# Patient Record
Sex: Female | Born: 1965 | Hispanic: Yes | Marital: Single | Smoking: Former smoker
Health system: Southern US, Community
[De-identification: ages and names within clinical notes are randomized; demographics above are authoritative.]

---

## 2020-04-26 ENCOUNTER — Emergency Department
Admission: EM | Admit: 2020-04-26 | Discharge: 2020-04-26 | Disposition: A | Discharge status: Home / Self Care | Payer: Medicaid Other | Attending: Student in an Organized Health Care Education/Training Program | Admitting: Student in an Organized Health Care Education/Training Program

## 2020-04-26 ENCOUNTER — Emergency Department: Payer: Medicaid Other

## 2020-04-26 ENCOUNTER — Other Ambulatory Visit: Payer: Self-pay

## 2020-04-26 DIAGNOSIS — R2232 Localized swelling, mass and lump, left upper limb: Secondary | ICD-10-CM | POA: Insufficient documentation

## 2020-04-26 DIAGNOSIS — Z20822 Contact with and (suspected) exposure to covid-19: Secondary | ICD-10-CM | POA: Diagnosis not present

## 2020-04-26 DIAGNOSIS — R2243 Localized swelling, mass and lump, lower limb, bilateral: Secondary | ICD-10-CM | POA: Insufficient documentation

## 2020-04-26 DIAGNOSIS — R519 Headache, unspecified: Secondary | ICD-10-CM | POA: Diagnosis not present

## 2020-04-26 DIAGNOSIS — M791 Myalgia, unspecified site: Secondary | ICD-10-CM | POA: Diagnosis present

## 2020-04-26 DIAGNOSIS — R531 Weakness: Secondary | ICD-10-CM | POA: Diagnosis not present

## 2020-04-26 DIAGNOSIS — R0989 Other specified symptoms and signs involving the circulatory and respiratory systems: Secondary | ICD-10-CM | POA: Insufficient documentation

## 2020-04-26 LAB — URINALYSIS, COMPLETE (UACMP) WITH MICROSCOPIC
Bilirubin Urine: NEGATIVE
Glucose, UA: NEGATIVE mg/dL
Hgb urine dipstick: NEGATIVE
Ketones, ur: NEGATIVE mg/dL
Leukocytes,Ua: NEGATIVE
Nitrite: NEGATIVE
Protein, ur: NEGATIVE mg/dL
Specific Gravity, Urine: 1.003 — ABNORMAL LOW (ref 1.005–1.030)
pH: 6 (ref 5.0–8.0)

## 2020-04-26 LAB — CBC
HCT: 37.9 % (ref 36.0–46.0)
Hemoglobin: 12.9 g/dL (ref 12.0–15.0)
MCH: 29.8 pg (ref 26.0–34.0)
MCHC: 34 g/dL (ref 30.0–36.0)
MCV: 87.5 fL (ref 80.0–100.0)
Platelets: 251 10*3/uL (ref 150–400)
RBC: 4.33 MIL/uL (ref 3.87–5.11)
RDW: 12.6 % (ref 11.5–15.5)
WBC: 7.3 10*3/uL (ref 4.0–10.5)
nRBC: 0 % (ref 0.0–0.2)

## 2020-04-26 LAB — BASIC METABOLIC PANEL
Anion gap: 12 (ref 5–15)
BUN: 11 mg/dL (ref 6–20)
CO2: 24 mmol/L (ref 22–32)
Calcium: 9.6 mg/dL (ref 8.9–10.3)
Chloride: 104 mmol/L (ref 98–111)
Creatinine, Ser: 0.64 mg/dL (ref 0.44–1.00)
GFR calc Af Amer: >60 mL/min (ref >60)
GFR calc non Af Amer: >60 mL/min (ref >60)
Glucose, Bld: 92 mg/dL (ref 70–99)
Potassium: 4.1 mmol/L (ref 3.5–5.1)
Sodium: 140 mmol/L (ref 135–145)

## 2020-04-26 LAB — RESPIRATORY PANEL BY RT PCR (FLU A&B, COVID)
Influenza A by PCR: NEGATIVE
Influenza B by PCR: NEGATIVE
SARS Coronavirus 2 by RT PCR: NEGATIVE

## 2020-04-26 LAB — GROUP A STREP BY PCR: Group A Strep by PCR: NOT DETECTED

## 2020-04-26 NOTE — ED Provider Notes (Signed)
[facility] Emergency Department Provider Note  ___________________________________________   First MD Initiated Contact with Patient 04/26/20 1413     (approximate)  I have reviewed the triage vital signs and the nursing notes.   HISTORY  Chief Complaint Generalized body aches, wrist swelling, throat scratching  HPI Kaitlyn Sims is a 54 y.o. female who presents to the emergency department for evaluation of generalized body aches, wrist swelling, throat "scratchiness" that began 2 weeks ago when she started a new job.  She also states that she has a headache and swelling in her legs.  She believes that this began when she started working with chemicals at her new job, particularly "Oxivir".  She states that she has searched online and the symptoms she believes could be related to over exposure to oxivir.  She states that she has not been around anybody that is been sick that she is aware of.  She did get Covid tested 2 days ago and was negative at that time.  She denies chest pain, shortness of breath, abdominal pain, nausea vomiting or diarrhea.      No past medical history on file.  There are no problems to display for this patient.     Prior to Admission medications   Not on File    Allergies Patient has no allergy information on record.  No family history on file.  Social History Social History   Tobacco Use  . Smoking status: Not on file  Substance Use Topics  . Alcohol use: Not on file  . Drug use: Not on file    Review of Systems Constitutional: No fever/chills Eyes: No visual changes. ENT: " Scratchy" throat Cardiovascular: Denies chest pain. Respiratory: Denies shortness of breath. Gastrointestinal: No abdominal pain.  No nausea, no vomiting.  No diarrhea.  No constipation. Genitourinary: Negative for dysuria. Musculoskeletal: Negative for back pain. Skin: Negative for rash. Neurological: + headaches, negative for focal  weakness or numbness.   ____________________________________________   PHYSICAL EXAM:  VITAL SIGNS: ED Triage Vitals [04/26/20 1338]  Enc Vitals Group     BP (!) 146/80     Pulse Rate 75     Resp 18     Temp 98.8 F (37.1 C)     Temp Source Oral     SpO2 100 %     Weight 193 lb (87.5 kg)     Height 5\' 7"  (1.702 m)     Head Circumference      Peak Flow      Pain Score 0     Pain Loc      Pain Edu?      Excl. in GC?    Constitutional: Alert and oriented. Well appearing and in no acute distress. Eyes: Conjunctivae are normal. PERRL. EOMI. Head: Atraumatic. Nose: No congestion/rhinnorhea. Mouth/Throat: Mucous membranes are moist.  Oropharynx erythematous without exudate or tonsillar enlargement. Neck: No stridor.   Cardiovascular: Normal rate, regular rhythm. Grossly normal heart sounds.  Good peripheral circulation.  Respiratory: Normal respiratory effort.  No retractions. Lungs CTAB. Gastrointestinal: Soft and nontender. No distention. No CVA tenderness. Musculoskeletal: No upper extremity tenderness or swelling noted.  No lower extremity tenderness nor edema.  No joint effusions. Neurologic:  Normal speech and language. No gross focal neurologic deficits are appreciated. No gait instability. Skin:  Skin is warm, dry and intact. No rash noted. Psychiatric: Mood and affect are normal. Speech and behavior are normal. ____________________________________________   LABS (all labs ordered are listed,  but only abnormal results are displayed)  Labs Reviewed  URINALYSIS, COMPLETE (UACMP) WITH MICROSCOPIC - Abnormal; Notable for the following components:      Result Value   Color, Urine COLORLESS (*)    APPearance CLEAR (*)    Specific Gravity, Urine 1.003 (*)    Bacteria, UA RARE (*)    All other components within normal limits  RESPIRATORY PANEL BY RT PCR (FLU A&B, COVID)  GROUP A STREP BY PCR  CBC  BASIC METABOLIC PANEL    ___________________________________________  RADIOLOGY  Official radiology report(s): DG Chest Portable 1 View  Result Date: 04/26/2020 CLINICAL DATA:  Weakness.  Body aches.  Extremity swelling. EXAM: PORTABLE CHEST 1 VIEW COMPARISON:  None. FINDINGS: The cardiomediastinal contours are normal. The lungs are clear. Pulmonary vasculature is normal. No consolidation, pleural effusion, or pneumothorax. No acute osseous abnormalities are seen. IMPRESSION: No acute chest findings.  No evidence of fluid overload. Electronically Signed   By: Narda Rutherford M.D.   On: 04/26/2020 15:09    ____________________________________________   INITIAL IMPRESSION / ASSESSMENT AND PLAN / ED COURSE  As part of my medical decision making, I reviewed the following data within the electronic MEDICAL RECORD NUMBER Nursing notes reviewed and incorporated        Kaitlyn Sims is a 54 year old female who presents emergency department for evaluation of generalized malaise and fatigue, headache and subjective bilateral wrist swelling and bilateral lower extremity swelling.  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  Given that this has been 2 weeks of general malaise in the height of the COVID-19 pandemic, work-up was completed to rule out infectious source of her symptoms.  The patient has a normal CBC and BMP, negative respiratory panel and negative strep swab.  Patient also has a grossly normal urinalysis.  Chest x-ray is grossly normal.  At this time, the patient has a normal laboratory evaluation making infection unlikely to be a source of her symptoms.  The patient was recommended to have close follow-up with a primary care physician.  Explained multiple times to the patient that I do not have a test to determine if this is related to the chemicals that she is encountering at work.  She is understanding of this and will follow up with primary care or return to the  emergency department for any worsening.      ____________________________________________   FINAL CLINICAL IMPRESSION(S) / ED DIAGNOSES  Final diagnoses:  Weakness     ED Discharge Orders    None      *Please note:  Kaitlyn Sims was evaluated in Emergency Department on 04/26/2020 for the symptoms described in the history of present illness. She was evaluated in the context of the global COVID-19 pandemic, which necessitated consideration that the patient might be at risk for infection with the SARS-CoV-2 virus that causes COVID-19. Institutional protocols and algorithms that pertain to the evaluation of patients at risk for COVID-19 are in a state of rapid change based on information released by regulatory bodies including the CDC and federal and state organizations. These policies and algorithms were followed during the patient's care in the ED.  Some ED evaluations and interventions may be delayed as a result of limited staffing during and the pandemic.*   Note:  This document was prepared using Dragon voice recognition software and may include unintentional dictation errors.    Lucy Chris, PA 04/26/20 1826    Willy Eddy,  MD 04/27/20 3794

## 2020-04-26 NOTE — ED Notes (Signed)
Pt A&O, ambulatory. 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 C/o of HA, body aches, and arm swelling since then.

## 2020-04-26 NOTE — ED Triage Notes (Signed)
Pt here with wrist swelling, body aches, and throat scratchiness that started 2 weeks ago after she started a new job and started working with different chemicals. PT also c/o of HA and bilateral leg swelling. 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 Pt was tested for covid 2 days ago, negative result.

## 2020-06-19 ENCOUNTER — Ambulatory Visit: Payer: Self-pay | Attending: Oncology | Admitting: *Deleted

## 2020-06-19 ENCOUNTER — Other Ambulatory Visit: Payer: Self-pay

## 2020-06-19 ENCOUNTER — Encounter: Payer: Self-pay | Admitting: *Deleted

## 2020-06-19 VITALS — BP 114/67 | HR 79 | Temp 98.0°F | Ht 66.17 in | Wt 200.6 lb

## 2020-06-19 DIAGNOSIS — N6459 Other signs and symptoms in breast: Secondary | ICD-10-CM

## 2020-06-19 NOTE — Progress Notes (Signed)
Subjective:     Patient ID: Kaitlyn Sims, female   DOB: 04/25/1966, 54 y.o.   MRN: [id number]  HPI   [facility] Medical History Record - 06/18/20 1655      Breast History   Screening cycle New    Provider (CBE) [employer]    Initial Mammogram --   2 years   Last Herbalist (Mammogram)  [facility]    Recent Breast Symptoms None      Breast Cancer History   Breast Cancer History No personal or family history      Previous History of Breast Problems   Breast Surgery or Biopsy None    Breast Implants N/A    BSE Done Monthly      Gynecological/Obstetrical History   LMP --   stopped period 2 years ago   Is there any chance that the client could be pregnant?  No    Age at menarche 48    Age at menopause 41    PAP smear history Annually    Date of last PAP  05/30/20    Provider (PAP) [employer]    Age at first live birth 29    Breast fed children Yes (type length in comments)   12 months   DES Exposure No    Cervical, Uterine or Ovarian cancer No    Family history of Cervial, Uterine or Ovarian cancer No    Hysterectomy No    Cervix removed No    Ovaries removed No    Laser/Cryosurgery No    Current method of birth control None    Current method of Estrogen/Hormone replacement None    Smoking history Yes   3 cigarettes   Comments 4 in household ; 1240/ month             Review of Systems     Objective:   Physical Exam Chest:     Breasts:        Right: No swelling, bleeding, inverted nipple, mass, nipple discharge, skin change or tenderness.        Left: No swelling, bleeding, inverted nipple, mass, nipple discharge, skin change or tenderness.    Lymphadenopathy:     Upper Body:     Right upper body: No supraclavicular or axillary adenopathy.     Left upper body: No supraclavicular or axillary adenopathy.        Assessment:     54 year old English speaking Hispanic female referred to [facility] by the  [facility] for clinical breast exam and mammogram only .  Clinical breast exam without dominant mass, skin changes, nipple discharge or lymphadenopathy.  There is an asymmetrical thickening at 12:00 at the left breast.  Taught self breast awareness.  Last pap on 05/14/20 was completed at the [facility].  Those results are not available for review.  She will follow ASCCP guidelines for her next pap.  Patient has been screened for eligibility.  She does not have any insurance, Medicare or Medicaid.  She also meets financial eligibility.   Risk Assessment    Risk Scores      06/19/2020   Last edited by: Scarlett Presto, RN   5-year risk: 0.5 %   Lifetime risk: 4.4 %            Plan:     Will get bilateral diagnostic mammogram and ultrasound.  Patient to go by [facility]  to sign a consent for release of information to get her previous mammogram images from [place].  Will get her scheduled as soon as they are available per the breast center's protocol.  Message sent to [facility] and to Joellyn Quails to schedule patient as soon as imaging is available.  Will follow up per [facility] protocol.

## 2020-06-19 NOTE — Patient Instructions (Signed)
Gave patient hand-out, Women Staying Healthy, Active and Well from [facility], with education on breast health, pap smears, heart and colon health. 

## 2020-07-11 ENCOUNTER — Ambulatory Visit
Admission: RE | Admit: 2020-07-11 | Discharge: 2020-07-11 | Disposition: A | Discharge status: Home / Self Care | Payer: Medicaid Other | Source: Ambulatory Visit | Attending: Oncology | Admitting: Oncology

## 2020-07-11 ENCOUNTER — Encounter: Payer: Self-pay | Admitting: *Deleted

## 2020-07-11 ENCOUNTER — Other Ambulatory Visit: Payer: Self-pay

## 2020-07-11 DIAGNOSIS — N6459 Other signs and symptoms in breast: Secondary | ICD-10-CM

## 2020-07-11 NOTE — Progress Notes (Signed)
Letter mailed from the [facility] to inform patient of her normal mammogram results.  Patient is to follow-up with annual screening in one year. 

## 2021-09-05 IMAGING — MG DIGITAL DIAGNOSTIC BILAT W/ TOMO W/ CAD
6 of 10 series · 6 of 30 positions shown · non-contrast
Comparison: Previous exam(s).

CLINICAL DATA: 54-year-old female with provider palpated
"thickening" along the upper left breast.

EXAM:
DIGITAL DIAGNOSTIC BILATERAL MAMMOGRAM WITH CAD AND TOMO
ULTRASOUND LEFT BREAST

[L MLO synth-2D (1 of 2)]
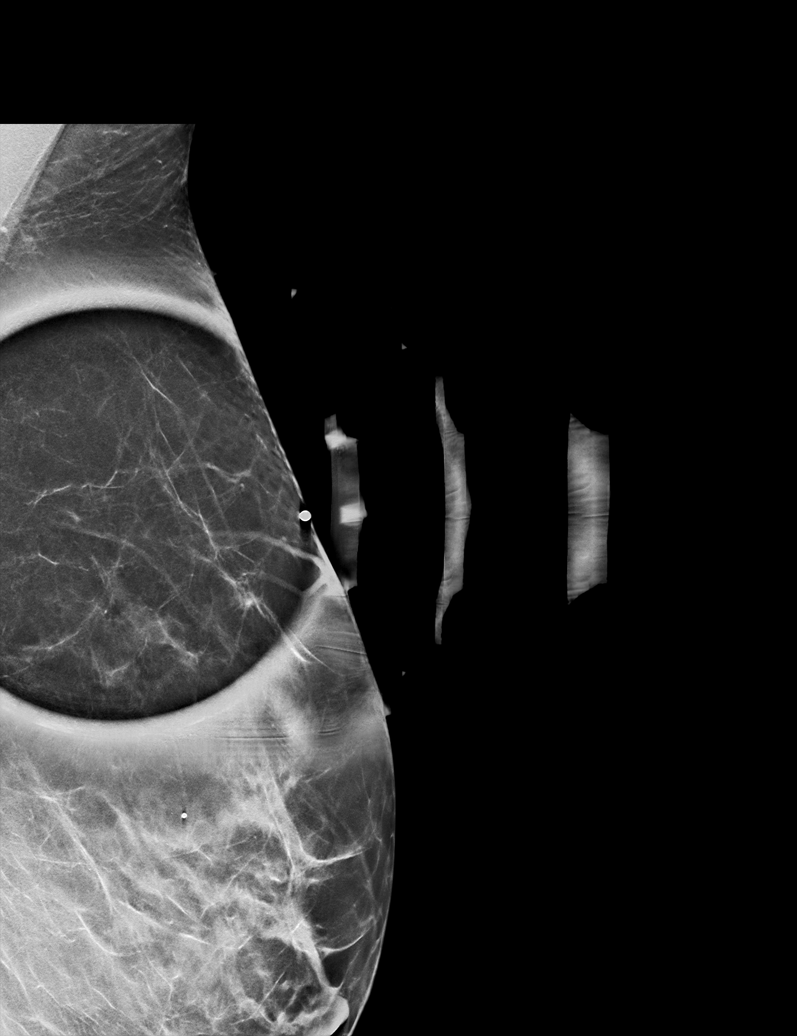

[R CC synth-2D]
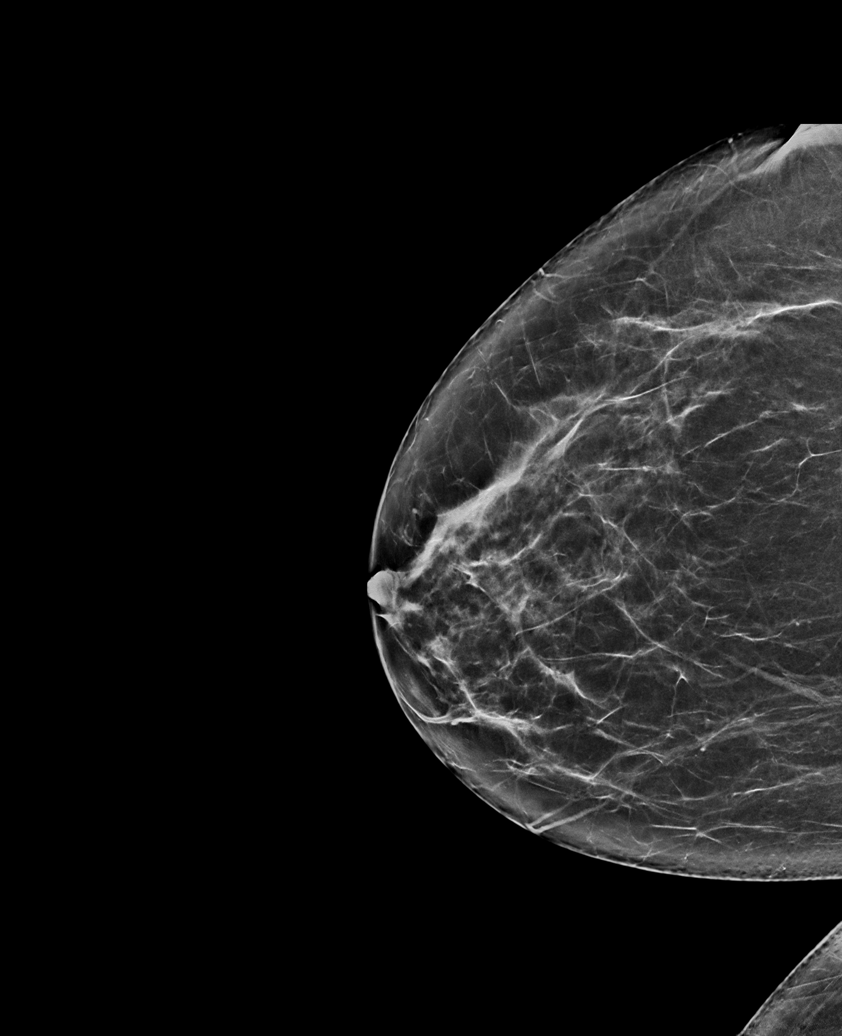

[L CC synth-2D]
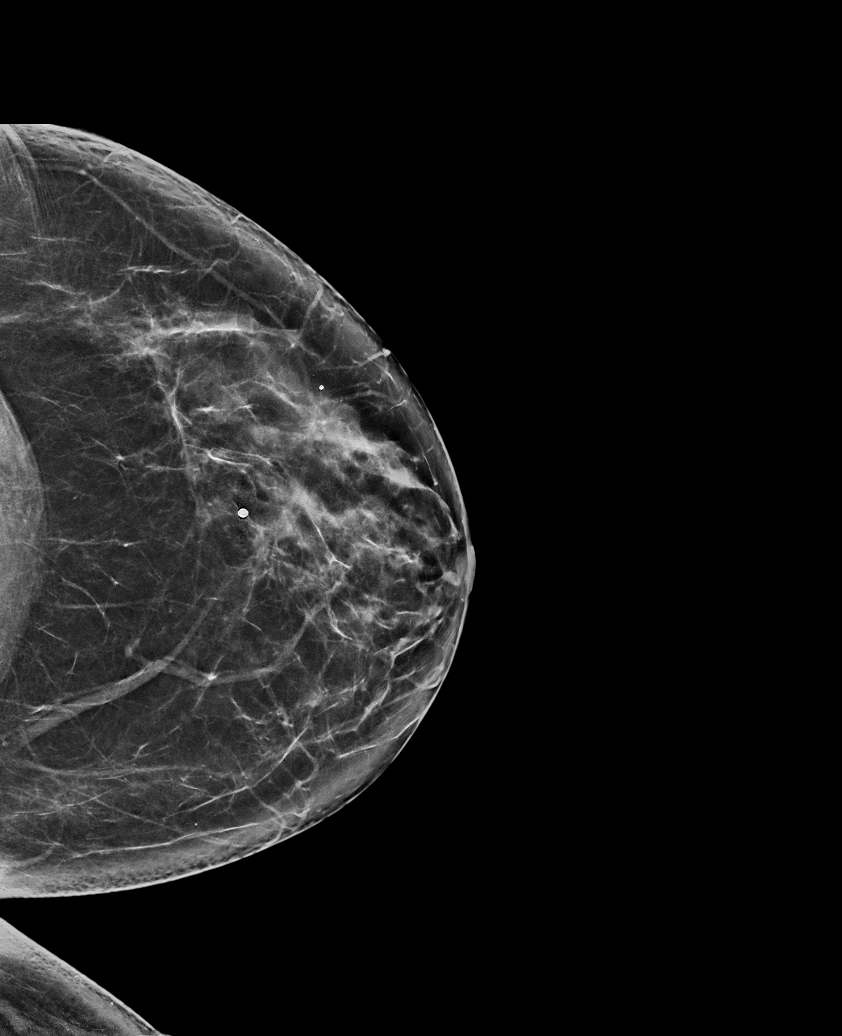

[R MLO synth-2D]
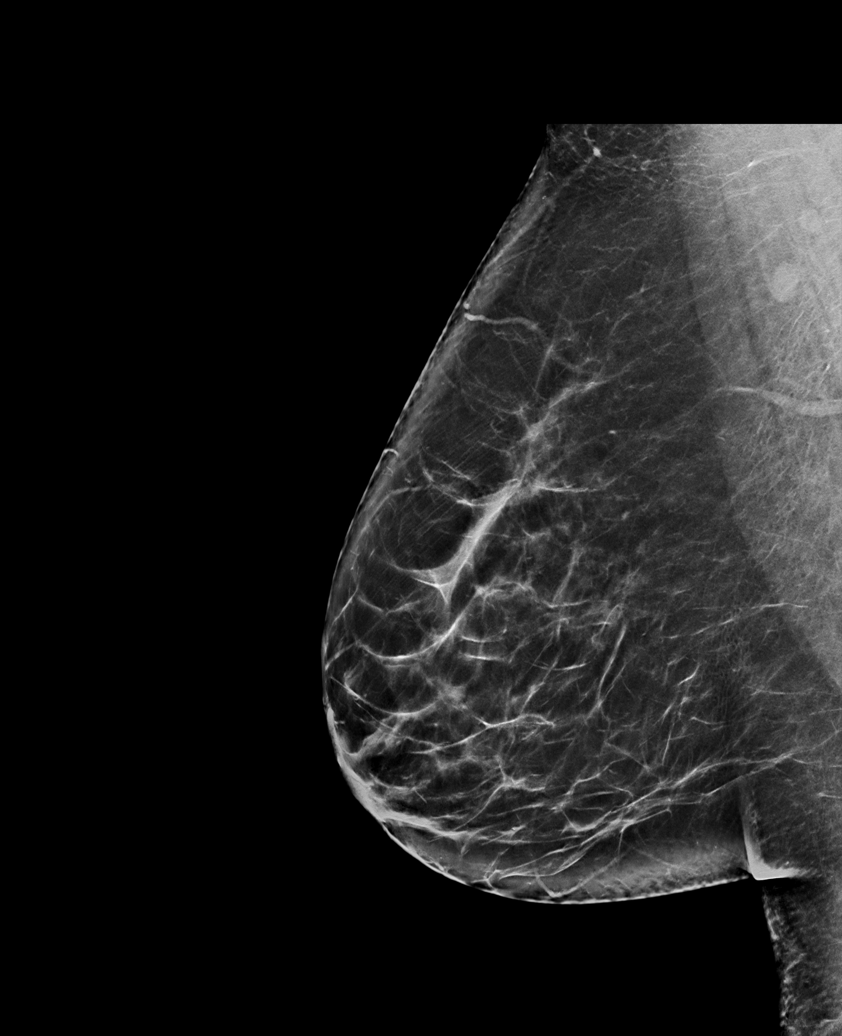

[L MLO synth-2D (2 of 2)]
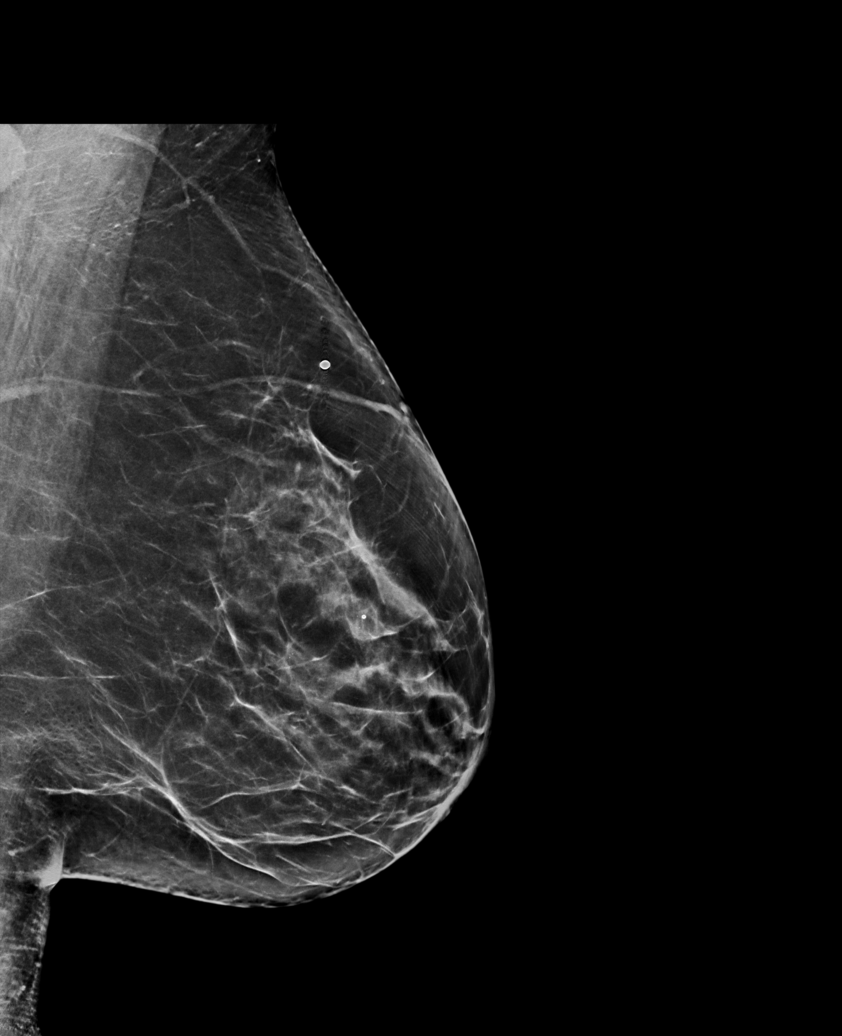

[L MLO tomo · tomo slice 33/66.0]
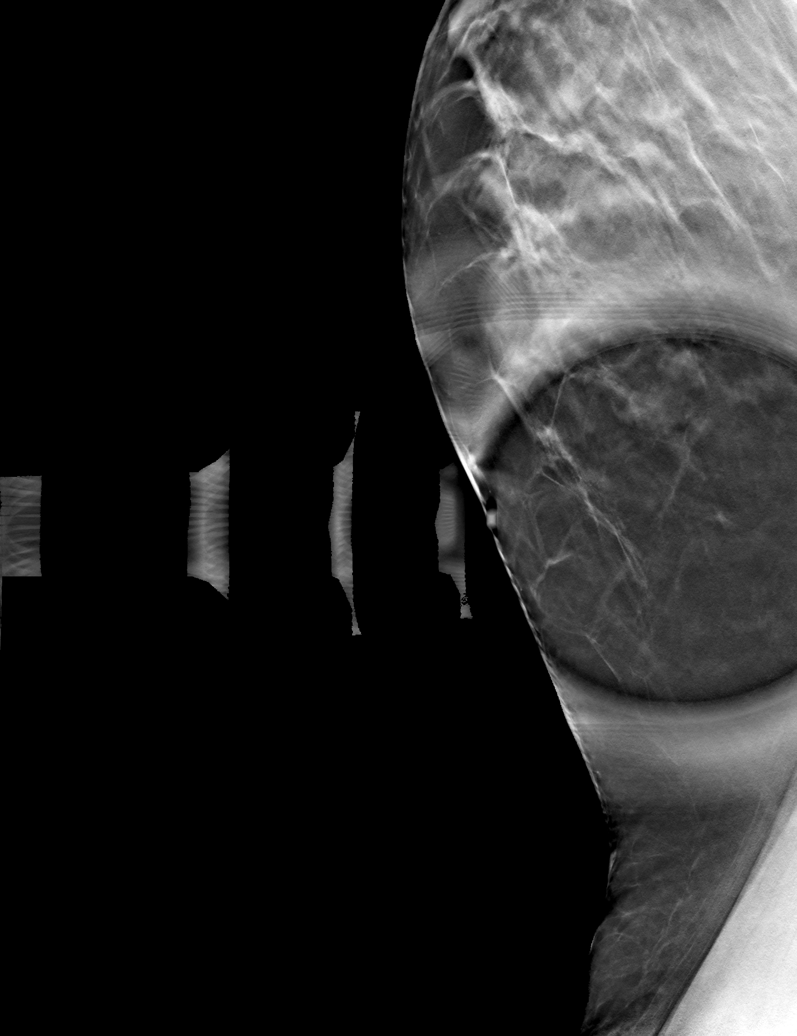

[6 of 30 positions shown; findings below may reference images not displayed]

ACR Breast Density Category c: The breast tissue is heterogeneously
dense, which may obscure small masses.
FINDINGS: A radiopaque BB was placed at the site of the patient's palpable
abnormality in the upper left breast. No focal or suspicious
mammographic findings are seen deep to the radiopaque BB. No
suspicious findings are identified in the remainder of either
breast.

Mammographic images were processed with CAD.

Targeted ultrasound is performed, showing normal fibroglandular
tissue without focal or suspicious sonographic abnormality.
Evaluation along the upper left breast was performed.
IMPRESSION: 1. No mammographic evidence of malignancy in either breast.
2. No suspicious sonographic findings at the site of the patient's
left breast palpable abnormality. Recommendation is for clinical and
symptomatic follow-up.

RECOMMENDATION:
1. Clinical follow-up recommended for the palpable area of concern
in the left breast. Any further workup should be based on clinical
grounds.
2.  Screening mammogram in one year.(Code:JR-O-H5V)

I have discussed the findings and recommendations with the patient.
If applicable, a reminder letter will be sent to the patient
regarding the next appointment.

BI-RADS CATEGORY  1: Negative.

## 2023-05-07 LAB — COLOGUARD

## 2024-03-09 ENCOUNTER — Ambulatory Visit

## 2024-04-06 ENCOUNTER — Ambulatory Visit: Admitting: Family Medicine

## 2024-05-02 ENCOUNTER — Ambulatory Visit: Admitting: Family Medicine

## 2024-06-07 ENCOUNTER — Ambulatory Visit (INDEPENDENT_AMBULATORY_CARE_PROVIDER_SITE_OTHER)

## 2024-06-07 VITALS — BP 122/64 | HR 85 | Resp 16 | Ht 67.0 in | Wt 207.2 lb

## 2024-06-07 DIAGNOSIS — Z136 Encounter for screening for cardiovascular disorders: Secondary | ICD-10-CM | POA: Diagnosis not present

## 2024-06-07 DIAGNOSIS — Z6832 Body mass index (BMI) 32.0-32.9, adult: Secondary | ICD-10-CM

## 2024-06-07 DIAGNOSIS — M545 Low back pain, unspecified: Secondary | ICD-10-CM | POA: Diagnosis not present

## 2024-06-07 DIAGNOSIS — Z1231 Encounter for screening mammogram for malignant neoplasm of breast: Secondary | ICD-10-CM

## 2024-06-07 DIAGNOSIS — G8929 Other chronic pain: Secondary | ICD-10-CM

## 2024-06-07 DIAGNOSIS — E66811 Obesity, class 1: Secondary | ICD-10-CM

## 2024-06-07 DIAGNOSIS — Z131 Encounter for screening for diabetes mellitus: Secondary | ICD-10-CM | POA: Diagnosis not present

## 2024-06-07 DIAGNOSIS — Z114 Encounter for screening for human immunodeficiency virus [HIV]: Secondary | ICD-10-CM

## 2024-06-07 DIAGNOSIS — Z1159 Encounter for screening for other viral diseases: Secondary | ICD-10-CM

## 2024-06-07 NOTE — Progress Notes (Signed)
 New patient visit   Patient: JOYELLE SIEDLECKI   DOB: Dec 15, 1965   58 y.o. Female  MRN: [id number] Visit Date: 06/07/2024  Today's healthcare provider: Isaiah DELENA Pepper, MD   Chief Complaint  Patient presents with   New Patient (Initial Visit)    NP/Wt loss/ lower back pain (LT)   Subjective    Dilynn A Kasa is a 58 y.o. female who presents today as a new patient to establish care.   Discussed the use of AI scribe software for clinical note transcription with the patient, who gave verbal consent to proceed.  History of Present Illness BRENNYN ORTLIEB is a 58 year old female who presents with concerns about weight loss and back pain.  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 She maintains a healthy diet, cooking meals with rice, vegetables, and protein, and primarily drinks water, avoiding soda except on special occasions. She usually eats two meals a day but sometimes snacks on peanut butter and pretzels at night, feeling she might overeat these. She tried Mounjaro, a medication her mother used, and lost ten pounds, but is concerned about the cost and insurance coverage for weight loss medications.  She has been experiencing low back pain for the past two weeks, located on the left side. She takes Motrin when the pain is severe but prefers to avoid medication. She has tried stretching exercises and Icy Hot patches in the past, which provided temporary relief. The pain does not radiate down her legs, and there is no numbness or tingling. She feels the pain is more pronounced when her weight exceeds 200 pounds.  Her social history includes working at [employer], where she is physically active. She has four children and is a grandmother. She quit smoking two years ago after smoking 1-2 cigarettes daily for many years. She does not consume alcohol or use tobacco products currently. She  takes a multivitamin and occasionally drinks turmeric tea.  History reviewed. No pertinent past medical history. History reviewed. No pertinent surgical history. Family Status  Relation Name Status   Neg Hx  (Not Specified)  No partnership data on file   Family History  Problem Relation Age of Onset   Breast cancer Neg Hx    Social History   Socioeconomic History   Marital status: Single    Spouse name: Not on file   Number of children: Not on file   Years of education: Not on file   Highest education level: Not on file  Occupational History   Not on file  Tobacco Use   Smoking status: Former    Types: Cigarettes   Smokeless tobacco: Never  Substance and Sexual Activity   Alcohol use: Never   Drug use: Never   Sexual activity: Not on file  Other Topics Concern   Not on file  Social History Narrative   Not on file   Social Drivers of Health   Financial Resource Strain: Not on file  Food Insecurity: Not on file  Transportation Needs: Not on file  Physical Activity: Not on file  Stress: Not on file  Social Connections: Not on file   No outpatient medications prior to visit.   No facility-administered medications prior to visit.   Not on File  Reviews of Systems as noted in HPI.      Objective    BP 122/64 (BP Location: Right Arm, Patient Position: Sitting,  Cuff Size: Normal)   Pulse 85   Resp 16   Ht 5' 7 (1.702 m)   Wt 207 lb 3.2 oz (94 kg)   SpO2 100%   BMI 32.45 kg/m     Physical Exam Constitutional:      Appearance: Normal appearance.  HENT:     Head: Normocephalic and atraumatic.     Mouth/Throat:     Mouth: Mucous membranes are moist.  Eyes:     Pupils: Pupils are equal, round, and reactive to light.  Cardiovascular:     Rate and Rhythm: Normal rate and regular rhythm.     Heart sounds: Normal heart sounds.  Pulmonary:     Effort: Pulmonary effort is normal.     Breath sounds: Normal breath sounds.  Musculoskeletal:     Lumbar  back: Tenderness present. No bony tenderness.     Comments: L paraspinal muscle tenderness around L4-L5  Skin:    General: Skin is warm.  Neurological:     General: No focal deficit present.     Mental Status: She is alert.     Depression Screen    06/07/2024    9:25 AM  PHQ 2/9 Scores  PHQ - 2 Score 0  PHQ- 9 Score 6   No results found for any visits on 06/07/24.  Assessment & Plan      Problem List Items Addressed This Visit       Other   Class 1 obesity with body mass index (BMI) of 32.0 to 32.9 in adult   Relevant Orders   Amb Ref to Medical Weight Management   Chronic left-sided low back pain without sciatica - Primary   Relevant Orders   Ambulatory referral to Physical Therapy   Other Visit Diagnoses       Screening for cardiovascular condition       Relevant Orders   Comprehensive metabolic panel with GFR   Lipid panel     Screening for diabetes mellitus (DM)       Relevant Orders   Hemoglobin A1c     Need for hepatitis C screening test       Relevant Orders   Hepatitis C antibody     Screening for HIV (human immunodeficiency virus)       Relevant Orders   HIV Antibody (routine testing w rflx)     Screening mammogram for breast cancer       Relevant Orders   MM 3D SCREENING MAMMOGRAM BILATERAL BREAST      Assessment & Plan Obesity Difficulty losing weight despite active lifestyle and healthy diet. Interested in weight loss medications, but insurance coverage is limited. Blood work needed to assess for diabetes and other metabolic conditions. - Ordered blood work to assess blood sugar, kidney function, liver function, and cholesterol. - [facility] to [facility] in [place] for weight loss management. - Advised to check with [employer] Shield regarding coverage for weight loss medications. - Discussed eating a balanced diet and incorporating movement into daily routine.   Chronic left-sided low back pain Chronic,  uncontrolled. Persistent muscle pain not relieved by Motrin. No bony tenderness, no numbness or tingling. Pain may be exacerbated by weight gain. - Recommended 4% lidocaine patches (Salonpas) for pain relief. - Recommend ibuprofen and tylenol PRN, ice PRN - Referred to physical therapy for strengthening exercises for low back and abdominal muscles. - Advised against lifting heavy objects and recommended using legs when lifting at work  [employer]  Health Maintenance Due for Pap smear and mammogram. Last Pap smear 3-4 years ago. No recent mammogram. - Scheduled follow-up appointment in one month for Pap smear. - Referred for mammogram at [facility]  Return in about 4 weeks (around 07/05/2024) for pap smear, vaccines.      Isaiah DELENA Pepper, MD  [facility] [phone] (phone) [phone] (fax)

## 2024-06-07 NOTE — Patient Instructions (Signed)
 I recommend Salonpas lidocaine patches (4%).

## 2024-06-08 ENCOUNTER — Other Ambulatory Visit: Payer: Self-pay

## 2024-06-08 ENCOUNTER — Ambulatory Visit: Payer: Self-pay

## 2024-06-08 DIAGNOSIS — E7800 Pure hypercholesterolemia, unspecified: Secondary | ICD-10-CM

## 2024-06-08 LAB — COMPREHENSIVE METABOLIC PANEL WITH GFR
ALT: 16 IU/L (ref 0–32)
AST: 18 IU/L (ref 0–40)
Albumin: 4.3 g/dL (ref 3.8–4.9)
Alkaline Phosphatase: 112 IU/L (ref 49–135)
BUN/Creatinine Ratio: 20 (ref 9–23)
BUN: 14 mg/dL (ref 6–24)
Bilirubin Total: 0.3 mg/dL (ref 0.0–1.2)
CO2: 22 mmol/L (ref 20–29)
Calcium: 9.7 mg/dL (ref 8.7–10.2)
Chloride: 103 mmol/L (ref 96–106)
Creatinine, Ser: 0.7 mg/dL (ref 0.57–1.00)
Globulin, Total: 3.2 g/dL (ref 1.5–4.5)
Glucose: 92 mg/dL (ref 70–99)
Potassium: 4.5 mmol/L (ref 3.5–5.2)
Sodium: 141 mmol/L (ref 134–144)
Total Protein: 7.5 g/dL (ref 6.0–8.5)
eGFR: 101 mL/min/1.73 (ref >59)

## 2024-06-08 LAB — LIPID PANEL
Chol/HDL Ratio: 5.8 ratio — ABNORMAL HIGH (ref 0.0–4.4)
Cholesterol, Total: 291 mg/dL — ABNORMAL HIGH (ref 100–199)
HDL: 50 mg/dL (ref >39)
LDL Chol Calc (NIH): 227 mg/dL — ABNORMAL HIGH (ref 0–99)
Triglycerides: 85 mg/dL (ref 0–149)
VLDL Cholesterol Cal: 14 mg/dL (ref 5–40)

## 2024-06-08 LAB — HEMOGLOBIN A1C
Est. average glucose Bld gHb Est-mCnc: 114 mg/dL
Hgb A1c MFr Bld: 5.6 % (ref 4.8–5.6)

## 2024-06-08 LAB — HEPATITIS C ANTIBODY: Hep C Virus Ab: NONREACTIVE

## 2024-06-08 LAB — HIV ANTIBODY (ROUTINE TESTING W REFLEX): HIV Screen 4th Generation wRfx: NONREACTIVE

## 2024-06-08 MED ORDER — ATORVASTATIN CALCIUM 40 MG PO TABS: 40.0000 mg | ORAL_TABLET | Freq: Every day | ORAL | 3 refills | Status: AC

## 2024-06-08 NOTE — Telephone Encounter (Signed)
-----   Message from Isaiah DELENA Pepper sent at 06/08/2024  9:03 AM EST ----- Please call patient and ensure she has seen the message below. Please schedule her a follow up to see me in 4-6 weeks. ----- Message ----- From: Rebecka Memos Lab Results In Sent: 06/08/2024   7:37 AM EST To: Isaiah DELENA Pepper, MD

## 2024-06-13 ENCOUNTER — Encounter (INDEPENDENT_AMBULATORY_CARE_PROVIDER_SITE_OTHER): Payer: Self-pay

## 2024-07-05 ENCOUNTER — Other Ambulatory Visit (HOSPITAL_COMMUNITY): Admission: RE | Admit: 2024-07-05 | Discharge: 2024-07-05 | Disposition: A | Source: Ambulatory Visit

## 2024-07-05 ENCOUNTER — Ambulatory Visit

## 2024-07-05 VITALS — BP 117/67 | HR 80 | Resp 16 | Ht 67.0 in | Wt 205.0 lb

## 2024-07-05 DIAGNOSIS — Z124 Encounter for screening for malignant neoplasm of cervix: Secondary | ICD-10-CM

## 2024-07-05 DIAGNOSIS — N814 Uterovaginal prolapse, unspecified: Secondary | ICD-10-CM | POA: Insufficient documentation

## 2024-07-05 DIAGNOSIS — Z113 Encounter for screening for infections with a predominantly sexual mode of transmission: Secondary | ICD-10-CM | POA: Insufficient documentation

## 2024-07-05 DIAGNOSIS — E7800 Pure hypercholesterolemia, unspecified: Secondary | ICD-10-CM | POA: Insufficient documentation

## 2024-07-05 DIAGNOSIS — Z1211 Encounter for screening for malignant neoplasm of colon: Secondary | ICD-10-CM

## 2024-07-05 NOTE — Therapy (Incomplete)
 OUTPATIENT PHYSICAL THERAPY THORACOLUMBAR EVALUATION   Patient Name: Kaitlyn Sims MRN: [id number] DOB:06-16-1966, 58 y.o., female Today's Date: 07/05/2024  END OF SESSION:   No past medical history on file. No past surgical history on file. Patient Active Problem List   Diagnosis Date Noted   Pure hypercholesterolemia 07/05/2024   Uterine prolapse 07/05/2024   Class 1 obesity with body mass index (BMI) of 32.0 to 32.9 in adult 06/07/2024   Chronic left-sided low back pain without sciatica 06/07/2024    PCP: Franchot Isaiah DELENA, MD  REFERRING PROVIDER: Franchot Isaiah DELENA, MD  REFERRING DIAG:  [phone] (ICD-10-CM) - Chronic left-sided low back pain without sciatica    RATIONALE FOR EVALUATION AND TREATMENT: Rehabilitation  THERAPY DIAG: No diagnosis found.  ONSET DATE: ***  FOLLOW-UP APPT SCHEDULED WITH REFERRING PROVIDER: {yes/no:20286}    SUBJECTIVE:                                                                                                                                                                                         SUBJECTIVE STATEMENT:    Patient is a 58 y.o. female with a chief concern of chronic L sided low back pain.   PERTINENT HISTORY:   ***  She has been experiencing low back pain for the past two weeks, located on the left side. She takes Motrin when the pain is severe but prefers to avoid medication. She has tried stretching exercises and Icy Hot patches in the past, which provided temporary relief. The pain does not radiate down her legs, and there is no numbness or tingling. She feels the pain is more pronounced when her weight exceeds 200 pounds.  working at [employer], where she is physically active. She has four children and is a grandmother.    PAIN:    Pain Intensity: Present: /10, Best: /10, Worst: /10 Pain location: *** Pain Quality: {PAIN DESCRIPTION:21022940}  Radiating: {yes/no:20286}  Numbness/Tingling:  {yes/no:20286} Focal Weakness: {yes/no:20286} Aggravating factors: *** Relieving factors: *** 24-hour pain behavior: *** How long can you sit: How long can you stand: History of prior back injury, pain, surgery, or therapy: {yes/no:20286} Dominant hand: {RIGHT/LEFT:20294} Imaging: {yes/no:20286}  Red flags: Negative for bowel/bladder changes, saddle paresthesia, personal history of cancer, h/o spinal tumors, h/o compression fx, h/o abdominal aneurysm, abdominal pain, chills/fever, night sweats, nausea, vomiting, unrelenting pain, first onset of insidious LBP <20 y/o  PRECAUTIONS: {Therapy precautions:24002}  WEIGHT BEARING RESTRICTIONS: {Yes ***/No:24003}  FALLS: Has patient fallen in last 6 months? {fallsyesno:27318}  Living Environment Lives with: {[facility] lives with:25569::lives with their family} Lives in: {Lives in:25570} Stairs: {opstairs:27293} Has following equipment at home: {Assistive devices:23999}  Prior level of function: {PLOF:24004}  Occupational demands:   Hobbies:   Patient Goals: ***   OBJECTIVE:  Patient Surveys  {rehab surveys:24030}  Cognition Patient is oriented to person, place, and time.  Recent memory is intact.  Remote memory is intact.  Attention span and concentration are intact.  Expressive speech is intact.  Patient's fund of knowledge is within normal limits for educational level.    Gross Musculoskeletal Assessment Tremor: None Bulk: Normal Tone: Normal No visible step-off along spinal column, no signs of scoliosis  GAIT: Distance walked: *** Assistive device utilized: {Assistive devices:23999} Level of assistance: {Levels of assistance:24026} Comments: ***  Posture: Lumbar lordosis: WNL Iliac crest height: Equal bilaterally Lumbar lateral shift: Negative  AROM AROM (Normal range in degrees) AROM   Lumbar   Flexion (65)   Extension (30)   Right lateral flexion (25)   Left lateral flexion (25)   Right rotation (30)    Left rotation (30)       Hip Right Left  Flexion (125)    Extension (15)    Abduction (40)    Adduction     Internal Rotation (45)    External Rotation (45)        Knee    Flexion (135)    Extension (0)        Ankle    Dorsiflexion (20)    Plantarflexion (50)    Inversion (35)    Eversion (15)    (* = pain; Blank rows = not tested)  LE MMT: MMT (out of 5) Right  Left   Hip flexion    Hip extension    Hip abduction    Hip adduction    Hip internal rotation    Hip external rotation    Knee flexion    Knee extension    Ankle dorsiflexion    Ankle plantarflexion    Ankle inversion    Ankle eversion    (* = pain; Blank rows = not tested)  Sensation Grossly intact to light touch throughout bilateral LEs as determined by testing dermatomes L2-S2. Proprioception, stereognosis, and hot/cold testing deferred on this date.  Reflexes R/L Knee Jerk (L3/4): 2+/2+  Ankle Jerk (S1/2): 2+/2+   Muscle Length Hamstrings: R: {NEGATIVE/POSITIVE QNM:80001} L: {NEGATIVE/POSITIVE QNM:80001} Ely (quadriceps): R: {NEGATIVE/POSITIVE QNM:80001} L: {NEGATIVE/POSITIVE QNM:80001} Thomas (hip flexors): R: {NEGATIVE/POSITIVE QNM:80001} L: {NEGATIVE/POSITIVE QNM:80001} Ober: R: {NEGATIVE/POSITIVE QNM:80001} L: {NEGATIVE/POSITIVE QNM:80001}  Palpation Location Right Left         Lumbar paraspinals    Quadratus Lumborum    Gluteus Maximus    Gluteus Medius    Deep hip external rotators    PSIS    Fortin's Area (SIJ)    Greater Trochanter    (Blank rows = not tested) Graded on 0-4 scale (0 = no pain, 1 = pain, 2 = pain with wincing/grimacing/flinching, 3 = pain with withdrawal, 4 = unwilling to allow palpation)  Passive Accessory  Motion Pt denies reproduction of back pain with CPA L1-L5 and UPA bilaterally L1-L5. Generally, hypomobile throughout  Special Tests Lumbar Radiculopathy and Discogenic: Centralization and Peripheralization (SN 92, -LR 0.12): {NEGATIVE/POSITIVE  FOR:19998} Slump (SN 83, -LR 0.32): R: {NEGATIVE/POSITIVE QNM:80001} L: {NEGATIVE/POSITIVE FOR:19998} SLR (SN 92, -LR 0.29): R: {NEGATIVE/POSITIVE QNM:80001} L:  {NEGATIVE/POSITIVE QNM:80001} Crossed SLR (SP 90): R: {NEGATIVE/POSITIVE QNM:80001} L: {NEGATIVE/POSITIVE QNM:80001}  Facet Joint: Extension-Rotation (SN 100, -LR 0.0): R: {NEGATIVE/POSITIVE QNM:80001} L: {NEGATIVE/POSITIVE QNM:80001}  Lumbar Foraminal Stenosis: Lumbar quadrant (SN 70): R: {NEGATIVE/POSITIVE QNM:80001} L: {NEGATIVE/POSITIVE  QNM:80001}  Hip: FABER (SN 81): R: {NEGATIVE/POSITIVE FOR:19998} L: {NEGATIVE/POSITIVE QNM:80001} FADIR (SN 94): R: {NEGATIVE/POSITIVE FOR:19998} L: {NEGATIVE/POSITIVE QNM:80001} Hip scour (SN 50): R: {NEGATIVE/POSITIVE QNM:80001} L: {NEGATIVE/POSITIVE QNM:80001}  SIJ:  Thigh Thrust (SN 88, -LR 0.18) : R: {NEGATIVE/POSITIVE QNM:80001} L: {NEGATIVE/POSITIVE QNM:80001}  Piriformis Syndrome: FAIR Test (SN 88, SP 83): R: {NEGATIVE/POSITIVE QNM:80001} L: {NEGATIVE/POSITIVE QNM:80001}  Functional Tasks Lifting: Deep squat: Sit to stand: Forward Step-Down Test: R:  L:  Lateral Step-Down Test: R:  L:   Beighton scale  LEFT  RIGHT           1. Passive dorsiflexion and hyperextension of the fifth MCP joint beyond 90  0 0   2. Passive apposition of the thumb to the flexor aspect of the forearm  0  0   3. Passive hyperextension of the elbow beyond 10  0  0   4. Passive hyperextension of the knee beyond 10  0  0   5. Active forward flexion of the trunk with the knees fully extended so that the palms of the hands rest flat on the floor   0   TOTAL         0/ 9      TODAY'S TREATMENT: DATE: ***     PATIENT EDUCATION:  Education details: *** Person educated: {Person educated:25204} Education method: {Education Method:25205} Education comprehension: {Education Comprehension:25206}   HOME EXERCISE PROGRAM:     ASSESSMENT:  CLINICAL IMPRESSION: Patient is a *** y.o. *** who was  seen today for physical therapy evaluation and treatment for ***.   OBJECTIVE IMPAIRMENTS: {opptimpairments:25111}.   ACTIVITY LIMITATIONS: {activitylimitations:27494}  PARTICIPATION LIMITATIONS: {participationrestrictions:25113}  PERSONAL FACTORS: {Personal factors:25162} are also affecting patient's functional outcome.   REHAB POTENTIAL: {rehabpotential:25112}  CLINICAL DECISION MAKING: {clinical decision making:25114}  EVALUATION COMPLEXITY: {Evaluation complexity:25115}   GOALS: Goals reviewed with patient? {yes/no:20286}  SHORT TERM GOALS: Target date: {follow up:25551}  Pt will be independent with HEP in order to improve strength and decrease back pain to improve pain-free function at home and work. Baseline: *** Goal status: INITIAL   LONG TERM GOALS: Target date: {follow up:25551}  Pt will increase FOTO to at least *** to demonstrate significant improvement in function at home and work related to back pain  Baseline:  Goal status: INITIAL  2.  Pt will decrease worst back pain by at least 2 points on the NPRS in order to demonstrate clinically significant reduction in back pain. Baseline: *** Goal status: INITIAL  3.  Pt will decrease mODI score by at least 13 points in order demonstrate clinically significant reduction in back pain/disability.       Baseline: *** Goal status: INITIAL  4.  *** Baseline: *** Goal status: INITIAL   PLAN:  PT FREQUENCY: {rehab frequency:25116}  PT DURATION: {rehab duration:25117}  PLANNED INTERVENTIONS: {rehab planned interventions:25118::97110-Therapeutic exercises,97530- Therapeutic [phone]- Neuromuscular re-education,97535- Self Rjmz,02859- Manual therapy,Patient/Family education}  PLAN FOR NEXT SESSION: ***   Lonni Pall PT, DPT Physical Therapist- [place]  07/05/2024, 10:17 PM

## 2024-07-05 NOTE — Patient Instructions (Signed)
 [facility] at [facility]   [address], [address] [facility] [place],  [place]  [address] Get Driving Directions Main: [phone]

## 2024-07-05 NOTE — Progress Notes (Signed)
 Established patient visit   Patient: Kaitlyn Sims   DOB: 06/14/1966   58 y.o. Female  MRN: [id number] Visit Date: 07/05/2024  Today's healthcare provider: Isaiah DELENA Pepper, MD   Chief Complaint  Patient presents with   Follow-up    4 wk f/u.SABRA No other concerns   Subjective    HPI  Discussed the use of AI scribe software for clinical note transcription with the patient, who gave verbal consent to proceed.  History of Present Illness Kaitlyn Sims is a 58 year old female who presents for follow-up and Pap smear.  She has been diagnosed with high cholesterol and started taking medication for it last week. Initially, there was confusion regarding the prescription, but she has since begun taking the medication as directed. No side effects from the medication have been reported. She is concerned about her diet, particularly her intake of eggs.  Her last Pap smear was prolonged due to difficulty locating the cervix. Denies abnormal pap smears. She has four children and notes that her last child was large.   Medications: Outpatient Medications Prior to Visit  Medication Sig   atorvastatin  (LIPITOR) 40 MG tablet Take 1 tablet (40 mg total) by mouth daily.   No facility-administered medications prior to visit.    Review of Systems as noted in HPI.      Objective    BP 117/67 (BP Location: Left Arm, Patient Position: Sitting, Cuff Size: Normal)   Pulse 80   Resp 16   Ht 5' 7 (1.702 m)   Wt 205 lb (93 kg)   SpO2 98%   BMI 32.11 kg/m    Physical Exam Exam conducted with a chaperone present.  Constitutional:      Appearance: Normal appearance.  HENT:     Head: Normocephalic and atraumatic.     Mouth/Throat:     Mouth: Mucous membranes are moist.  Eyes:     Pupils: Pupils are equal, round, and reactive to light.  Pulmonary:     Effort: Pulmonary effort is normal.  Genitourinary:    General: Normal vulva.     Vagina: Normal.     Uterus: With  uterine prolapse.      Comments: Cervix posterior, difficult to fully visualize due to uterine prolapse. Skin:    General: Skin is warm.  Neurological:     General: No focal deficit present.     Mental Status: She is alert.      No results found for any visits on 07/05/24.  Assessment & Plan     Problem List Items Addressed This Visit       Genitourinary   Uterine prolapse   Relevant Orders   Ambulatory referral to Obstetrics / Gynecology     Other   Pure hypercholesterolemia - Primary   Other Visit Diagnoses       Screening for colon cancer       Relevant Orders   Cologuard     Screening for cervical cancer       Relevant Orders   Cytology - PAP      Assessment & Plan Woman's Wellness Visit Due for pap smear, mammogram, and colonoscopy. Pap smear performed with difficulty due to uterine prolapse. - Performed Pap smear today - Provided number for mammogram scheduling. - Sent Cologuard kit for colon cancer screening.  Uterine prolapse Difficulty in Pap smear due to uterine prolapse. Discussed symptoms and need for OB GYN evaluation. - Referred to OB GYN  for evaluation of uterine prolapse.  Hyperlipidemia LDL 227 on recent labs. Recently started on atorvastatin  40mg . Emphasized dietary modifications and medication adherence to prevent heart disease. - Continue cholesterol medication. - Recheck cholesterol levels in one month. - Advised dietary modifications to reduce cholesterol intake.   Return in about 4 weeks (around 08/02/2024) for cholesterol follow up.       Isaiah DELENA Pepper, MD  [facility] [phone] (phone) [phone] (fax)

## 2024-07-06 ENCOUNTER — Ambulatory Visit

## 2024-07-08 ENCOUNTER — Ambulatory Visit: Payer: Self-pay

## 2024-07-08 LAB — CYTOLOGY - PAP
Chlamydia: NEGATIVE
Comment: NEGATIVE
Comment: NEGATIVE
Comment: NEGATIVE
Comment: NORMAL
Diagnosis: NEGATIVE
Diagnosis: REACTIVE
High risk HPV: NEGATIVE
Neisseria Gonorrhea: NEGATIVE
Trichomonas: NEGATIVE

## 2024-07-12 NOTE — Therapy (Signed)
 OUTPATIENT PHYSICAL THERAPY THORACOLUMBAR EVALUATION/TREATMENT   Patient Name: Kaitlyn Sims MRN: [id number] DOB:April 04, 1966, 58 y.o., female Today's Date: 07/12/2024  END OF SESSION:   No past medical history on file. No past surgical history on file. Patient Active Problem List   Diagnosis Date Noted   Pure hypercholesterolemia 07/05/2024   Uterine prolapse 07/05/2024   Class 1 obesity with body mass index (BMI) of 32.0 to 32.9 in adult 06/07/2024   Chronic left-sided low back pain without sciatica 06/07/2024    PCP: Franchot Isaiah DELENA, MD  REFERRING PROVIDER: Franchot Isaiah DELENA, MD  REFERRING DIAG:  [phone] (ICD-10-CM) - Chronic left-sided low back pain without sciatica   RATIONALE FOR EVALUATION AND TREATMENT: Rehabilitation  THERAPY DIAG: No diagnosis found.  ONSET DATE: 2 mos   FOLLOW-UP APPT SCHEDULED WITH REFERRING PROVIDER: Yes    SUBJECTIVE:                                                                                                                                                                                         SUBJECTIVE STATEMENT:    Patient is a 58 y.o. female with a chief concern of chronic L sided low back pain.   PERTINENT HISTORY:   Patient reports that her L lower back has been painful for over a month and half ago. Patient contributes her lower back pain to her sleeping position and work demands. 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 She refrains from lifting heavy objects. Alleviating factors include heat modalities and motrin to help with the pain. Aggravating factors include: laying down in certain positions. Prolong standing with home chores. Navigating stairs at apartment (~ 30 steps)  She denies numbness and tingling, radiating pain or direct trauma, Negative for bowel/bladder changes, saddle paresthesia  Social hx: She has four children (two at home 107 and 15  year old).   Imaging: No  PAIN:    Pain Intensity: Present: 2/10, Best: 2/10, Worst:7/10 Pain location: L Lower back, L Gluteal  Pain Quality: intermittent and aching Radiating: No Numbness and tingling: No    PRECAUTIONS: Fall  WEIGHT BEARING RESTRICTIONS: No  FALLS: Has patient fallen in last 6 months? No  Living Environment Lives with: lives with their family 90 and 56 year old Lives in: House/apartment Stairs: Yes: External: 30 steps; on left going up Has following equipment at home: None  Prior level of function: Independent  Occupational demands: Lift 50#   Hobbies: Proofreader   Patient Goals: To help improve my lower back pain    OBJECTIVE:  Patient Surveys  Modified Oswestry:  MODIFIED OSWESTRY DISABILITY SCALE  Date: 07/13/2024 Score  Pain intensity 4 =  Pain medication provides me with little relief from pain.  2. Personal care (washing, dressing, etc.) 1 =  I can take care of myself normally, but it increases my pain.  3. Lifting 1 = I can lift heavy weights, but it causes increased pain.  4. Walking 1 = Pain prevents me from walking more than 1 mile.  5. Sitting 3 =  Pain prevents me from sitting more than  hour.  6. Standing 1 =  I can stand as long as I want but, it increases my pain.  7. Sleeping 2 =  Even when I take pain medication, I sleep less than 6 hours  8. Social Life 1 =  My social life is normal, but it increases my level of pain.  9. Traveling 2 =  My pain restricts my travel over 2 hours.  10. Employment/ Homemaking 3 = Pain prevents me from doing anything but light duties.  Total 19/50   Interpretation of scores: Score Category Description  0-20% Minimal Disability The patient can cope with most living activities. Usually no treatment is indicated apart from advice on lifting, sitting and exercise  21-40% Moderate Disability The patient experiences more pain and difficulty with sitting, lifting and standing. Travel and social life  are more difficult and they may be disabled from work. Personal care, sexual activity and sleeping are not grossly affected, and the patient can usually be managed by conservative means  41-60% Severe Disability Pain remains the main problem in this group, but activities of daily living are affected. These patients require a detailed investigation  61-80% Crippled Back pain impinges on all aspects of the patients life. Positive intervention is required  81-100% Bed-bound These patients are either bed-bound or exaggerating their symptoms  Bluford FORBES Zoe DELENA Karon DELENA, et al. Surgery versus conservative management of stable thoracolumbar fracture: the PRESTO feasibility RCT. [place] ([place]): [employer]; 2021 Nov. [facility] Technology Assessment, No. 25.62.) Appendix 3, Oswestry Disability Index category descriptors. Available from: Findjewelers.cz  Minimally Clinically Important Difference (MCID) = 12.8%  Cognition WNL     Gross Musculoskeletal Assessment Tremor: None Bulk: Normal Tone: Normal No visible step-off along spinal column, no signs of scoliosis  GAIT: Distance walked: 24m Assistive device utilized: None Level of assistance: Complete Independence Comments: WNL, reciprocal gait   Posture: Lumbar lordosis: WNL Iliac crest height: Equal bilaterally Lumbar lateral shift: Negative  AROM AROM (Normal range in degrees) AROM   Lumbar   Flexion (65) 100%*   Extension (30) 100%  Right lateral flexion (25) 100  Left lateral flexion (25) 100*   Right rotation (30) 100  Left rotation (30) 100       Hip Right Left  Flexion (125) WFL  WFL  Extension (15)    Abduction (40)    Adduction     Internal Rotation (45)    External Rotation (45)        Knee    Flexion (135)    Extension (0)        Ankle    Dorsiflexion (20)    Plantarflexion (50)    Inversion (35)    Eversion (15)    (* = pain; Blank rows = not tested)  LE MMT: MMT  (out of 5) Right  Left   Hip flexion 4- 4-  Hip extension    Hip abduction 4 4  Hip adduction    Hip  internal rotation 4 4  Hip external rotation 4 4  Knee flexion 5 5  Knee extension 5 5  Ankle dorsiflexion 5 5  Ankle plantarflexion 5 5  Ankle inversion    Ankle eversion    (* = pain; Blank rows = not tested)  Sensation Grossly intact to light touch throughout bilateral LEs as determined by testing dermatomes L2-S2. Proprioception, stereognosis, and hot/cold testing deferred on this date.  Reflexes R/L Knee Jerk (L3/4): 2+/2+  Ankle Jerk (S1/2): 2+/2+   Muscle Length Hamstrings: R: Negative L: Negative  Palpation Location Right Left         Lumbar paraspinals  1  Quadratus Lumborum  1  Gluteus Maximus    Gluteus Medius  1  Deep hip external rotators    PSIS    Fortin's Area (SIJ)    Greater Trochanter    (Blank rows = not tested) Graded on 0-4 scale (0 = no pain, 1 = pain, 2 = pain with wincing/grimacing/flinching, 3 = pain with withdrawal, 4 = unwilling to allow palpation)  Passive Accessory  Motion Pt denies reproduction of back pain with CPA L1-L5 and UPA bilaterally L1-L5. Generally, hypomobile throughout. Pt endorsed improvements in pain following repeated CPA.   Special Tests Lumbar Radiculopathy and Discogenic: SLR (SN 92, -LR 0.29): R: Negative L:  Negative Crossed SLR (SP 90): R: Negative L: Negative  Hip: FABER (SN 81): R: Negative L: Negative FADIR (SN 94): R: Negative L: Negative  Functional Tasks Lifting: Deferred Sit to stand: WNL Lateral Step-Down Test: Deferred  TODAY'S TREATMENT: DATE: 07/13/2024  Therapeutic Exercise:  Reviewed HEP with patient return demonstration:   Access Code: [email or web address] URL: [email or web address] Date: 07/13/2024 Prepared by: Lonni Pall  Exercises - Supine Bridge  - 2 x daily - 7 x weekly - 2-3 sets - 10 reps - Straight Leg Raise  - 2 x daily - 7 x weekly - 2-3 sets - 10 reps - Supine  Single Knee to Chest  - 2 x daily - 7 x weekly - 3 sets - 30 hold - Child's Pose with Sidebending  - 2 x daily - 7 x weekly - 2-3 sets - 30s hold - Quadruped Thoracic Rotation - Reach Under  - 1 x daily - 7 x weekly - 10 sets - 5 hold   PATIENT EDUCATION:  Education details: HEP, Exercise Technique, Prognosis Person educated: Patient Education method: Explanation, Demonstration, and Handouts Education comprehension: verbalized understanding and returned demonstration   HOME EXERCISE PROGRAM:  Access Code: [email or web address] URL: [email or web address] Date: 07/13/2024 Prepared by: Lonni Pall  Exercises - Supine Bridge  - 2 x daily - 7 x weekly - 2-3 sets - 10 reps - Straight Leg Raise  - 2 x daily - 7 x weekly - 2-3 sets - 10 reps - Supine Single Knee to Chest  - 2 x daily - 7 x weekly - 3 sets - 30 hold - Child's Pose with Sidebending  - 2 x daily - 7 x weekly - 2-3 sets - 30s hold - Quadruped Thoracic Rotation - Reach Under  - 1 x daily - 7 x weekly - 10 sets - 5 hold   ASSESSMENT:  CLINICAL IMPRESSION: Patient is a 58 y.o. female who was seen today for physical therapy evaluation and treatment for lower back. Patient presenting with decreased strength in LE and pain along the lumbar paraspinals into the L gluteal muscle group. Hip flexion in supine initially at 90 deg bilaterally but improved with single  knee to chest stretching (110 deg). AROM grossly intact however she has pain with lumbar rotation and flexion based movements. Repeated CPA to lumbar segments (L3-L5) with tenderness but improved with repeated repetitions. Special testing negative for neural tension  Pain improved following HEP exercises at end of session. Self reported outcome (moDI) indicative of minimal to moderate disability with functional activities due to lower back pain. Signs and symptoms consistent with suspected mechanical lower back pain. She will benefit from skilled physical therapy interventions  in order to maximize return to PLOF and improve QoL.   OBJECTIVE IMPAIRMENTS: decreased activity tolerance, decreased strength, increased muscle spasms, and pain.   ACTIVITY LIMITATIONS: carrying, lifting, bending, standing, and stairs  PARTICIPATION LIMITATIONS: community activity and occupation  PERSONAL FACTORS: Age and Profession are also affecting patient's functional outcome.   REHAB POTENTIAL: Good  CLINICAL DECISION MAKING: Stable/uncomplicated  EVALUATION COMPLEXITY: Low   GOALS: Goals reviewed with patient? No  SHORT TERM GOALS: Target date: 08/10/2024  Pt will be independent with HEP in order to improve strength and decrease back pain to improve pain-free function at home and work. Baseline: 07/13/2024:  Goal status: INITIAL   LONG TERM GOALS: Target date: 09/07/2024  Pt will be able to squat and lift 40# kettlebell with good technique and minimal to no (< 3/10 NPS) pain in order to demonstrate ability to lift heavy objects at work.  Baseline: 07/13/2024: TBD Goal status: INITIAL  2.  Pt will decrease worst back pain by at least 2 points on the NPRS in order to demonstrate clinically significant reduction in back pain. Baseline: 07/13/2024:  Goal status: INITIAL  3.  Pt will decrease mODI score by at least 13 points in order demonstrate clinically significant reduction in back pain/disability.       Baseline: 07/13/2024: 19/50 - 38% Goal status: INITIAL  4.  Patient will be able to safely navigate a flight of 4 stairs 8 times (in clinic) using proper foot placement and handrail support, without requiring assistance from PT in order to demonstrate significant improvement in LE strength and safety. Baseline: 07/13/2024: TBD Goal status: INITIAL  5.  Pt will decrease 5TSTS by at least 3 seconds in order to demonstrate clinically significant improvement in LE strength. Baseline: 07/13/2024: TBD Goal status: INITIAL    PLAN:  PT FREQUENCY: 1-2x/week  PT  DURATION: 8 weeks  PLANNED INTERVENTIONS: 97164- PT Re-evaluation, 97110-Therapeutic exercises, 97530- Therapeutic activity, 97112- Neuromuscular re-education, 97535- Self Care, [id number]- Manual therapy, Patient/Family education, Stair training, Spinal mobilization, Cryotherapy, and Moist heat  PLAN FOR NEXT SESSION: Review HEP, Initiate Hip Strengthening, Core strengthening, Thoracolumbar mobility (mat level)   Lonni Pall PT, DPT Physical Therapist- [place]  07/12/2024, 9:31 PM

## 2024-07-13 ENCOUNTER — Ambulatory Visit

## 2024-07-13 DIAGNOSIS — G8929 Other chronic pain: Secondary | ICD-10-CM | POA: Diagnosis not present

## 2024-07-13 DIAGNOSIS — M545 Low back pain, unspecified: Secondary | ICD-10-CM | POA: Diagnosis not present

## 2024-07-13 DIAGNOSIS — M5459 Other low back pain: Secondary | ICD-10-CM | POA: Insufficient documentation

## 2024-07-13 DIAGNOSIS — M6281 Muscle weakness (generalized): Secondary | ICD-10-CM | POA: Insufficient documentation

## 2024-07-18 ENCOUNTER — Ambulatory Visit

## 2024-07-18 DIAGNOSIS — M5459 Other low back pain: Secondary | ICD-10-CM

## 2024-07-18 DIAGNOSIS — M6281 Muscle weakness (generalized): Secondary | ICD-10-CM

## 2024-07-18 NOTE — Therapy (Signed)
 " OUTPATIENT PHYSICAL THERAPY THORACOLUMBAR EVALUATION/TREATMENT   Patient Name: Kaitlyn Sims MRN: [id number] DOB:[birth year], 58 y.o., female Today's Date: 07/18/2024  END OF SESSION:  PT End of Session - 07/18/24 0955     Visit Number 2    Number of Visits 17    Date for Recertification  09/07/24    PT Start Time 0955    PT Stop Time 1030    PT Time Calculation (min) 35 min    Activity Tolerance Patient tolerated treatment well    Behavior During Therapy [facility] for tasks assessed/performed          History reviewed. No pertinent past medical history. History reviewed. No pertinent surgical history. Patient Active Problem List   Diagnosis Date Noted   Pure hypercholesterolemia 07/05/2024   Uterine prolapse 07/05/2024   Class 1 obesity with body mass index (BMI) of 32.0 to 32.9 in adult 06/07/2024   Chronic left-sided low back pain without sciatica 06/07/2024    PCP: Franchot Isaiah DELENA, MD  REFERRING PROVIDER: Franchot Isaiah DELENA, MD  REFERRING DIAG:  [phone] (ICD-10-CM) - Chronic left-sided low back pain without sciatica   RATIONALE FOR EVALUATION AND TREATMENT: Rehabilitation  THERAPY DIAG: Other low back pain  Muscle weakness (generalized)  ONSET DATE: 2 mos   FOLLOW-UP APPT SCHEDULED WITH REFERRING PROVIDER: Yes    SUBJECTIVE:                                                                                                                                                                                         SUBJECTIVE STATEMENT:    Patient is a 58 y.o. female with a chief concern of chronic L sided low back pain.   PERTINENT HISTORY:   Patient reports that her L lower back has been painful for over a month and half ago. Patient contributes her lower back pain to her sleeping position and work demands. 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 She refrains from lifting heavy objects.  Alleviating factors include heat modalities and motrin to help with the pain. Aggravating factors include: laying down in certain positions. Prolong standing with home chores. Navigating stairs at apartment (~ 30 steps)  She denies numbness and tingling, radiating pain or direct trauma, Negative for bowel/bladder changes, saddle paresthesia  Social hx: She has four children (two at home 77 and 16 year old).   Imaging: No  PAIN:    Pain Intensity: Present: 2/10, Best: 2/10, Worst:7/10 Pain location: L Lower back, L Gluteal  Pain Quality: intermittent and aching Radiating: No Numbness and tingling: No  PRECAUTIONS: Fall  WEIGHT BEARING RESTRICTIONS: No  FALLS: Has patient fallen in last 6 months? No  Living Environment Lives with: lives with their family 48 and 56 year old Lives in: House/apartment Stairs: Yes: External: 30 steps; on left going up Has following equipment at home: None  Prior level of function: Independent  Occupational demands: Lift 50#   Hobbies: Proofreader   Patient Goals: To help improve my lower back pain    OBJECTIVE:  Patient Surveys  Modified Oswestry:  MODIFIED OSWESTRY DISABILITY SCALE  Date: 07/13/2024 Score  Pain intensity 4 =  Pain medication provides me with little relief from pain.  2. Personal care (washing, dressing, etc.) 1 =  I can take care of myself normally, but it increases my pain.  3. Lifting 1 = I can lift heavy weights, but it causes increased pain.  4. Walking 1 = Pain prevents me from walking more than 1 mile.  5. Sitting 3 =  Pain prevents me from sitting more than  hour.  6. Standing 1 =  I can stand as long as I want but, it increases my pain.  7. Sleeping 2 =  Even when I take pain medication, I sleep less than 6 hours  8. Social Life 1 =  My social life is normal, but it increases my level of pain.  9. Traveling 2 =  My pain restricts my travel over 2 hours.  10. Employment/ Homemaking 3 = Pain prevents me from  doing anything but light duties.  Total 19/50   Interpretation of scores: Score Category Description  0-20% Minimal Disability The patient can cope with most living activities. Usually no treatment is indicated apart from advice on lifting, sitting and exercise  21-40% Moderate Disability The patient experiences more pain and difficulty with sitting, lifting and standing. Travel and social life are more difficult and they may be disabled from work. Personal care, sexual activity and sleeping are not grossly affected, and the patient can usually be managed by conservative means  41-60% Severe Disability Pain remains the main problem in this group, but activities of daily living are affected. These patients require a detailed investigation  61-80% Crippled Back pain impinges on all aspects of the patients life. Positive intervention is required  81-100% Bed-bound These patients are either bed-bound or exaggerating their symptoms  Bluford FORBES Zoe DELENA Karon DELENA, et al. Surgery versus conservative management of stable thoracolumbar fracture: the PRESTO feasibility RCT. [place] ([place]): [employer]; 2021 Nov. [facility] Technology Assessment, No. 25.62.) Appendix 3, Oswestry Disability Index category descriptors. Available from: [email or web address].cz  Minimally Clinically Important Difference (MCID) = 12.8%  Cognition WNL     Gross Musculoskeletal Assessment Tremor: None Bulk: Normal Tone: Normal No visible step-off along spinal column, no signs of scoliosis  GAIT: Distance walked: 78m Assistive device utilized: None Level of assistance: Complete Independence Comments: WNL, reciprocal gait   Posture: Lumbar lordosis: WNL Iliac crest height: Equal bilaterally Lumbar lateral shift: Negative  AROM AROM (Normal range in degrees) AROM   Lumbar   Flexion (65) 100%*   Extension (30) 100%  Right lateral flexion (25) 100  Left lateral flexion (25) 100*    Right rotation (30) 100  Left rotation (30) 100       Hip Right Left  Flexion (125) WFL  WFL  Extension (15)    Abduction (40)    Adduction     Internal Rotation (45)    External Rotation (45)  Knee    Flexion (135)    Extension (0)        Ankle    Dorsiflexion (20)    Plantarflexion (50)    Inversion (35)    Eversion (15)    (* = pain; Blank rows = not tested)  LE MMT: MMT (out of 5) Right  Left   Hip flexion 4- 4-  Hip extension    Hip abduction 4 4  Hip adduction    Hip internal rotation 4 4  Hip external rotation 4 4  Knee flexion 5 5  Knee extension 5 5  Ankle dorsiflexion 5 5  Ankle plantarflexion 5 5  Ankle inversion    Ankle eversion    (* = pain; Blank rows = not tested)  Sensation Grossly intact to light touch throughout bilateral LEs as determined by testing dermatomes L2-S2. Proprioception, stereognosis, and hot/cold testing deferred on this date.  Reflexes R/L Knee Jerk (L3/4): 2+/2+  Ankle Jerk (S1/2): 2+/2+   Muscle Length Hamstrings: R: Negative L: Negative  Palpation Location Right Left         Lumbar paraspinals  1  Quadratus Lumborum  1  Gluteus Maximus    Gluteus Medius  1  Deep hip external rotators    PSIS    Fortin's Area (SIJ)    Greater Trochanter    (Blank rows = not tested) Graded on 0-4 scale (0 = no pain, 1 = pain, 2 = pain with wincing/grimacing/flinching, 3 = pain with withdrawal, 4 = unwilling to allow palpation)  Passive Accessory  Motion Pt denies reproduction of back pain with CPA L1-L5 and UPA bilaterally L1-L5. Generally, hypomobile throughout. Pt endorsed improvements in pain following repeated CPA.   Special Tests Lumbar Radiculopathy and Discogenic: SLR (SN 92, -LR 0.29): R: Negative L:  Negative Crossed SLR (SP 90): R: Negative L: Negative  Hip: FABER (SN 81): R: Negative L: Negative FADIR (SN 94): R: Negative L: Negative  Functional Tasks Lifting: Deferred Sit to stand: WNL Lateral  Step-Down Test: Deferred  TODAY'S TREATMENT: DATE: 07/18/2024  Subjective: Patient had a busy weekend at work. She reports 4/10 NPS in her lower back. She reports pain in the upper neck but contributes it to poor sleep position. No further questions or concerns.   Therapeutic Exercise:   Supine Bridge   2 x 10    1 x 10 - with Adductor Squeeze     Hooklying OH Reach     3 x 10 - 3 Kg Med ball    Neutral Curl Up - RLE straight    5 sets x 10s hold    Lumbar Crossover Stretch   30s/bout x 2 in order to improve lumbar tissue extensibility   Supine Gluteal Stretch   30s/bout x 2 in order to improve lumbar tissue extensibility  Therapeutic Activity:   Stairway Navigation - SUE rail support - alternating pattern     8 Times - Muscle Fatigue and Slower tempo with last 3 rotation    Kettlebell Squats (20#)    2 x 10    Kettlebell Side Trunk Bending     2 x 10 - 20#    Kettlebell Suitcase Carry   2 x 12' - 20# KB - KB in RUE   2 x 12' - 30# KB - KB in R/L UE (L on last lap)    - Increased unsteadiness and decrease pelvic control with KB in L.    Standing Pallof Press  2 x 10 - 5# - Resistance from the L   PATIENT EDUCATION:  Education details: Exercise Technique  Person educated: Patient Education method: Explanation, Demonstration, and Handouts Education comprehension: verbalized understanding and returned demonstration   HOME EXERCISE PROGRAM:  Access Code: [email or web address] URL: [email or web address] Date: 07/13/2024 Prepared by: Lonni Pall  Exercises - Supine Bridge  - 2 x daily - 7 x weekly - 2-3 sets - 10 reps - Straight Leg Raise  - 2 x daily - 7 x weekly - 2-3 sets - 10 reps - Supine Single Knee to Chest  - 2 x daily - 7 x weekly - 3 sets - 30 hold - Child's Pose with Sidebending  - 2 x daily - 7 x weekly - 2-3 sets - 30s hold - Quadruped Thoracic Rotation - Reach Under  - 1 x daily - 7 x weekly - 10 sets - 5 hold   ASSESSMENT:  CLINICAL  IMPRESSION: Patient returned to [facility] for first f/u in management of lower back pain.Abbreviated session due to late arrival; main focus on improving core strength and proper biomechanics with work related tasks. Good tolerance to all exercises without exacerbation of pain in the L lower back. No pain following 8 rotations of stair steps and 30s STS. Some VC for proper squat form and lifting heavier object from the floor with KB. Patient endorsed improvements in lower back pain with adherence to HEP and decreased lifting at work. She still has intermittent lower back pain that limits her full participation with recreational tasks and work related tasks. PT to focus on improved hip strengthening and lower back mobility in following session. She will benefit from skilled physical therapy interventions in order to maximize return to PLOF and improve QoL.   OBJECTIVE IMPAIRMENTS: decreased activity tolerance, decreased strength, increased muscle spasms, and pain.   ACTIVITY LIMITATIONS: carrying, lifting, bending, standing, and stairs  PARTICIPATION LIMITATIONS: community activity and occupation  PERSONAL FACTORS: Age and Profession are also affecting patient's functional outcome.   REHAB POTENTIAL: Good  CLINICAL DECISION MAKING: Stable/uncomplicated  EVALUATION COMPLEXITY: Low   GOALS: Goals reviewed with patient? No  SHORT TERM GOALS: Target date: 08/10/2024  Pt will be independent with HEP in order to improve strength and decrease back pain to improve pain-free function at home and work. Baseline: 07/13/2024:  Goal status: INITIAL   LONG TERM GOALS: Target date: 09/07/2024  Pt will be able to squat and lift 40# kettlebell with good technique and minimal to no (< 3/10 NPS) pain in order to demonstrate ability to lift heavy objects at work.  Baseline: 07/13/2024: TBD Goal status: INITIAL  2.  Pt will decrease worst back pain by at least 2 points on the NPRS in order to demonstrate  clinically significant reduction in back pain. Baseline: 07/13/2024:  Goal status: INITIAL  3.  Pt will decrease mODI score by at least 13 points in order demonstrate clinically significant reduction in back pain/disability.       Baseline: 07/13/2024: 19/50 - 38% Goal status: INITIAL  4.  Patient will be able to safely navigate a flight of 4 stairs 8 times (in clinic) using proper foot placement and handrail support, without requiring assistance from PT in order to demonstrate significant improvement in LE strength and safety. Baseline: 07/13/2024: Accomplished  Safely without pain.  Goal status: INITIAL  5.  Pt will increase 30s STS by at least 5 reps in order to demonstrate clinically significant improvement in LE strength. Baseline: 07/13/2024: 12 reps  a Goal status: INITIAL    PLAN:  PT FREQUENCY: 1-2x/week  PT DURATION: 8 weeks  PLANNED INTERVENTIONS: 97164- PT Re-evaluation, 97110-Therapeutic exercises, 97530- Therapeutic activity, 97112- Neuromuscular re-education, 97535- Self Care, [id number]- Manual therapy, Patient/Family education, Stair training, Spinal mobilization, Cryotherapy, and Moist heat  PLAN FOR NEXT SESSION: Review HEP, Progress Hip Strengthening, Core strengthening, Thoracolumbar mobility (mat level)   Lonni Pall PT, DPT Physical Therapist- [place]  07/18/2024, 9:57 AM  "

## 2024-07-25 ENCOUNTER — Telehealth: Payer: Self-pay

## 2024-07-25 ENCOUNTER — Ambulatory Visit

## 2024-07-25 NOTE — Telephone Encounter (Signed)
 Called patient about missed appointment. No answer but LVM regarding next following appointment. Advised of no show policy and to call for future rescheduling or concerns.   Lonni Pall PT, DPT Physical Therapist- [place]

## 2024-07-27 ENCOUNTER — Ambulatory Visit

## 2024-08-01 ENCOUNTER — Ambulatory Visit

## 2024-08-01 DIAGNOSIS — M6281 Muscle weakness (generalized): Secondary | ICD-10-CM | POA: Insufficient documentation

## 2024-08-01 DIAGNOSIS — M5459 Other low back pain: Secondary | ICD-10-CM | POA: Insufficient documentation

## 2024-08-01 NOTE — Therapy (Signed)
 " OUTPATIENT PHYSICAL THERAPY THORACOLUMBAR EVALUATION/TREATMENT   Patient Name: Kaitlyn Sims MRN: [id number] DOB:1966/03/15, 59 y.o., female Today's Date: 08/01/2024  END OF SESSION:  PT End of Session - 08/01/24 0828     Visit Number 3    Number of Visits 17    Date for Recertification  09/07/24    PT Start Time 0825    PT Stop Time 0900    PT Time Calculation (min) 35 min    Activity Tolerance Patient tolerated treatment well    Behavior During Therapy [facility] for tasks assessed/performed          History reviewed. No pertinent past medical history. History reviewed. No pertinent surgical history. Patient Active Problem List   Diagnosis Date Noted   Pure hypercholesterolemia 07/05/2024   Uterine prolapse 07/05/2024   Class 1 obesity with body mass index (BMI) of 32.0 to 32.9 in adult 06/07/2024   Chronic left-sided low back pain without sciatica 06/07/2024    PCP: Franchot Isaiah DELENA, MD  REFERRING PROVIDER: Franchot Isaiah DELENA, MD  REFERRING DIAG:  [phone] (ICD-10-CM) - Chronic left-sided low back pain without sciatica   RATIONALE FOR EVALUATION AND TREATMENT: Rehabilitation  THERAPY DIAG: Other low back pain  Muscle weakness (generalized)  ONSET DATE: 2 mos   FOLLOW-UP APPT SCHEDULED WITH REFERRING PROVIDER: Yes    SUBJECTIVE:                                                                                                                                                                                         SUBJECTIVE STATEMENT:    Patient is a 59 y.o. female with a chief concern of chronic L sided low back pain.   PERTINENT HISTORY:   Patient reports that her L lower back has been painful for over a month and half ago. Patient contributes her lower back pain to her sleeping position and work demands. 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 She refrains from lifting heavy objects. Alleviating  factors include heat modalities and motrin to help with the pain. Aggravating factors include: laying down in certain positions. Prolong standing with home chores. Navigating stairs at apartment (~ 30 steps)  She denies numbness and tingling, radiating pain or direct trauma, Negative for bowel/bladder changes, saddle paresthesia  Social hx: She has four children (two at home 75 and 5 year old).   Imaging: No  PAIN:    Pain Intensity: Present: 2/10, Best: 2/10, Worst:7/10 Pain location: L Lower back, L Gluteal  Pain Quality: intermittent and aching Radiating: No Numbness and tingling: No  PRECAUTIONS: Fall  WEIGHT BEARING RESTRICTIONS: No  FALLS: Has patient fallen in last 6 months? No  Living Environment Lives with: lives with their family 40 and 57 year old Lives in: House/apartment Stairs: Yes: External: 30 steps; on left going up Has following equipment at home: None  Prior level of function: Independent  Occupational demands: Lift 50#   Hobbies: Proofreader   Patient Goals: To help improve my lower back pain    OBJECTIVE:  Patient Surveys  Modified Oswestry:  MODIFIED OSWESTRY DISABILITY SCALE  Date: 07/13/2024 Score  Pain intensity 4 =  Pain medication provides me with little relief from pain.  2. Personal care (washing, dressing, etc.) 1 =  I can take care of myself normally, but it increases my pain.  3. Lifting 1 = I can lift heavy weights, but it causes increased pain.  4. Walking 1 = Pain prevents me from walking more than 1 mile.  5. Sitting 3 =  Pain prevents me from sitting more than  hour.  6. Standing 1 =  I can stand as long as I want but, it increases my pain.  7. Sleeping 2 =  Even when I take pain medication, I sleep less than 6 hours  8. Social Life 1 =  My social life is normal, but it increases my level of pain.  9. Traveling 2 =  My pain restricts my travel over 2 hours.  10. Employment/ Homemaking 3 = Pain prevents me from doing  anything but light duties.  Total 19/50   Interpretation of scores: Score Category Description  0-20% Minimal Disability The patient can cope with most living activities. Usually no treatment is indicated apart from advice on lifting, sitting and exercise  21-40% Moderate Disability The patient experiences more pain and difficulty with sitting, lifting and standing. Travel and social life are more difficult and they may be disabled from work. Personal care, sexual activity and sleeping are not grossly affected, and the patient can usually be managed by conservative means  41-60% Severe Disability Pain remains the main problem in this group, but activities of daily living are affected. These patients require a detailed investigation  61-80% Crippled Back pain impinges on all aspects of the patients life. Positive intervention is required  81-100% Bed-bound These patients are either bed-bound or exaggerating their symptoms  Bluford FORBES Zoe DELENA Karon DELENA, et al. Surgery versus conservative management of stable thoracolumbar fracture: the PRESTO feasibility RCT. [place] ([place]): [employer]; 2021 Nov. [facility] Technology Assessment, No. 25.62.) Appendix 3, Oswestry Disability Index category descriptors. Available from: [email or web address].cz  Minimally Clinically Important Difference (MCID) = 12.8%  Cognition WNL     Gross Musculoskeletal Assessment Tremor: None Bulk: Normal Tone: Normal No visible step-off along spinal column, no signs of scoliosis  GAIT: Distance walked: 80m Assistive device utilized: None Level of assistance: Complete Independence Comments: WNL, reciprocal gait   Posture: Lumbar lordosis: WNL Iliac crest height: Equal bilaterally Lumbar lateral shift: Negative  AROM AROM (Normal range in degrees) AROM   Lumbar   Flexion (65) 100%*   Extension (30) 100%  Right lateral flexion (25) 100  Left lateral flexion (25) 100*   Right  rotation (30) 100  Left rotation (30) 100       Hip Right Left  Flexion (125) WFL  WFL  Extension (15)    Abduction (40)    Adduction     Internal Rotation (45)    External Rotation (45)  Knee    Flexion (135)    Extension (0)        Ankle    Dorsiflexion (20)    Plantarflexion (50)    Inversion (35)    Eversion (15)    (* = pain; Blank rows = not tested)  LE MMT: MMT (out of 5) Right  Left   Hip flexion 4- 4-  Hip extension    Hip abduction 4 4  Hip adduction    Hip internal rotation 4 4  Hip external rotation 4 4  Knee flexion 5 5  Knee extension 5 5  Ankle dorsiflexion 5 5  Ankle plantarflexion 5 5  Ankle inversion    Ankle eversion    (* = pain; Blank rows = not tested)  Sensation Grossly intact to light touch throughout bilateral LEs as determined by testing dermatomes L2-S2. Proprioception, stereognosis, and hot/cold testing deferred on this date.  Reflexes R/L Knee Jerk (L3/4): 2+/2+  Ankle Jerk (S1/2): 2+/2+   Muscle Length Hamstrings: R: Negative L: Negative  Palpation Location Right Left         Lumbar paraspinals  1  Quadratus Lumborum  1  Gluteus Maximus    Gluteus Medius  1  Deep hip external rotators    PSIS    Fortin's Area (SIJ)    Greater Trochanter    (Blank rows = not tested) Graded on 0-4 scale (0 = no pain, 1 = pain, 2 = pain with wincing/grimacing/flinching, 3 = pain with withdrawal, 4 = unwilling to allow palpation)  Passive Accessory  Motion Pt denies reproduction of back pain with CPA L1-L5 and UPA bilaterally L1-L5. Generally, hypomobile throughout. Pt endorsed improvements in pain following repeated CPA.   Special Tests Lumbar Radiculopathy and Discogenic: SLR (SN 92, -LR 0.29): R: Negative L:  Negative Crossed SLR (SP 90): R: Negative L: Negative  Hip: FABER (SN 81): R: Negative L: Negative FADIR (SN 94): R: Negative L: Negative  Functional Tasks Lifting: Deferred Sit to stand: WNL Lateral Step-Down Test:  Deferred  TODAY'S TREATMENT: DATE: 08/01/2024  Subjective: Patient reports 2/10 NPS in her lower back.  No further questions or concerns.   Therapeutic Exercise:   Supine Bridge   1 x 10 - AROM    2 x 10 - adductor squeeze with blue ball   Supine 90/90 with OH reach (Weighted Dowel)    3 x 10    Supine 90/90 with Torso rotation (med ball between legs)     2 x 10 - alternating dir - 3 Kg MB   Thoracic Open/Close Book    2 x 10 - Alter dir    Time spent reviewing HEP, frequency and proper form for exercises and updated HEP.    Therapeutic Activity:   Kettlebell Squats (20#)    2 x 10    Standing Pallof Press   1 x 10 - 5#     Tall Kneeling Pallof Press   2 x 10  - 10#    Seated Torso Twist - Alternating Dir    3 x 10 - 3 Kg med ball  PATIENT EDUCATION:  Education details: Exercise Technique  Person educated: Patient Education method: Explanation, Demonstration, and Handouts Education comprehension: verbalized understanding and returned demonstration   HOME EXERCISE PROGRAM:  Access Code: [email or web address] URL: [email or web address] Date: 07/13/2024 Prepared by: Lonni Pall  Exercises - Supine Bridge  - 2 x daily - 7 x weekly - 2-3 sets - 10 reps - Straight Leg Raise  -  2 x daily - 7 x weekly - 2-3 sets - 10 reps - Supine Single Knee to Chest  - 2 x daily - 7 x weekly - 3 sets - 30 hold - Child's Pose with Sidebending  - 2 x daily - 7 x weekly - 2-3 sets - 30s hold - Quadruped Thoracic Rotation - Reach Under  - 1 x daily - 7 x weekly - 10 sets - 5 hold   ASSESSMENT:  CLINICAL IMPRESSION: Continued PT POC in management of lower back back pain. Session abbreviated due to later arrival. She tolerated all exercises without pain in the lower back. Focused on mat level core exercises with thoracolumbar mobility. Good carryover from previous session with kettlebell lift, no VC from PT for proper form. Remainder of session PT reviewed HEP and addressed  questions regarding new exercises. Patient's pain continues to respond well to light mobility and adherence to HEP. She still has intermittent lower back pain that limits her full participation with recreational tasks and work related tasks.  She will benefit from skilled physical therapy interventions in order to maximize return to PLOF and improve QoL.   OBJECTIVE IMPAIRMENTS: decreased activity tolerance, decreased strength, increased muscle spasms, and pain.   ACTIVITY LIMITATIONS: carrying, lifting, bending, standing, and stairs  PARTICIPATION LIMITATIONS: community activity and occupation  PERSONAL FACTORS: Age and Profession are also affecting patient's functional outcome.   REHAB POTENTIAL: Good  CLINICAL DECISION MAKING: Stable/uncomplicated  EVALUATION COMPLEXITY: Low   GOALS: Goals reviewed with patient? No  SHORT TERM GOALS: Target date: 08/10/2024  Pt will be independent with HEP in order to improve strength and decrease back pain to improve pain-free function at home and work. Baseline: 07/13/2024:  Goal status: INITIAL   LONG TERM GOALS: Target date: 09/07/2024  Pt will be able to squat and lift 40# kettlebell with good technique and minimal to no (< 3/10 NPS) pain in order to demonstrate ability to lift heavy objects at work.  Baseline: 07/13/2024: TBD Goal status: INITIAL  2.  Pt will decrease worst back pain by at least 2 points on the NPRS in order to demonstrate clinically significant reduction in back pain. Baseline: 07/13/2024:  Goal status: INITIAL  3.  Pt will decrease mODI score by at least 13 points in order demonstrate clinically significant reduction in back pain/disability.       Baseline: 07/13/2024: 19/50 - 38% Goal status: INITIAL  4.  Patient will be able to safely navigate a flight of 4 stairs 8 times (in clinic) using proper foot placement and handrail support, without requiring assistance from PT in order to demonstrate significant  improvement in LE strength and safety. Baseline: 07/13/2024: Accomplished  Safely without pain.  Goal status: INITIAL  5.  Pt will increase 30s STS by at least 5 reps in order to demonstrate clinically significant improvement in LE strength. Baseline: 07/13/2024: 12 reps a Goal status: INITIAL    PLAN:  PT FREQUENCY: 1-2x/week  PT DURATION: 8 weeks  PLANNED INTERVENTIONS: 97164- PT Re-evaluation, 97110-Therapeutic exercises, 97530- Therapeutic activity, 97112- Neuromuscular re-education, 97535- Self Care, [id number]- Manual therapy, Patient/Family education, Stair training, Spinal mobilization, Cryotherapy, and Moist heat  PLAN FOR NEXT SESSION: Review HEP, Progress Hip Strengthening, Core strengthening, Thoracolumbar mobility (mat level)   Lonni Pall PT, DPT Physical Therapist- [place]  08/01/2024, 8:29 AM  "

## 2024-08-03 ENCOUNTER — Ambulatory Visit

## 2024-08-08 ENCOUNTER — Ambulatory Visit

## 2024-08-08 DIAGNOSIS — M6281 Muscle weakness (generalized): Secondary | ICD-10-CM

## 2024-08-08 DIAGNOSIS — M5459 Other low back pain: Secondary | ICD-10-CM

## 2024-08-08 NOTE — Progress Notes (Unsigned)
" °  ° ° °  Established patient visit   Patient: Kaitlyn Sims   DOB: Nov 28, 1965   59 y.o. Female  MRN: [id number] Visit Date: 08/09/2024  Today's healthcare provider: Isaiah DELENA Pepper, MD   No chief complaint on file.  Subjective    HPI  Discussed the use of AI scribe software for clinical note transcription with the patient, who gave verbal consent to proceed.  History of Present Illness      Medications: Show/hide medication list[1]  Review of Systems as noted in HPI.  {Insert previous labs (optional):23779} {See past labs  Heme  Chem  Endocrine  Serology  Results Review (optional):1}   Objective    There were no vitals taken for this visit. {Insert last BP/Wt (optional):23777}{See vitals history (optional):1}  Physical Exam   No results found for any visits on 08/09/24.  Assessment & Plan     Problem List Items Addressed This Visit   None   Assessment and Plan Assessment & Plan       No follow-ups on file.       Isaiah DELENA Pepper, MD  [facility] [phone] (phone) [phone] (fax)    [1]  Outpatient Medications Prior to Visit  Medication Sig   atorvastatin  (LIPITOR) 40 MG tablet Take 1 tablet (40 mg total) by mouth daily.   No facility-administered medications prior to visit.   "

## 2024-08-08 NOTE — Therapy (Signed)
 " OUTPATIENT PHYSICAL THERAPY THORACOLUMBAR EVALUATION/TREATMENT   Patient Name: Kaitlyn Sims MRN: [id number] DOB:Dec 30, 1965, 59 y.o., female Today's Date: 08/08/2024  END OF SESSION:  PT End of Session - 08/08/24 0822     Visit Number 4    Number of Visits 17    Date for Recertification  09/07/24    Authorization Type [employer] [id number] for PT vsts from    Authorization Time Period 12/17-2/11/26    Authorization - Visit Number 4    Authorization - Number of Visits 16    Progress Note Due on Visit 10    PT Start Time 0821    PT Stop Time 0900    PT Time Calculation (min) 39 min    Activity Tolerance Patient tolerated treatment well    Behavior During Therapy [facility] for tasks assessed/performed          History reviewed. No pertinent past medical history. History reviewed. No pertinent surgical history. Patient Active Problem List   Diagnosis Date Noted   Pure hypercholesterolemia 07/05/2024   Uterine prolapse 07/05/2024   Class 1 obesity with body mass index (BMI) of 32.0 to 32.9 in adult 06/07/2024   Chronic left-sided low back pain without sciatica 06/07/2024    PCP: Franchot Isaiah DELENA, MD  REFERRING PROVIDER: Franchot Isaiah DELENA, MD  REFERRING DIAG:  [phone] (ICD-10-CM) - Chronic left-sided low back pain without sciatica   RATIONALE FOR EVALUATION AND TREATMENT: Rehabilitation  THERAPY DIAG: Other low back pain  Muscle weakness (generalized)  ONSET DATE: 2 mos   FOLLOW-UP APPT SCHEDULED WITH REFERRING PROVIDER: Yes    SUBJECTIVE:                                                                                                                                                                                         SUBJECTIVE STATEMENT:    Patient is a 59 y.o. female with a chief concern of chronic L sided low back pain.   PERTINENT HISTORY:   Patient reports that her L lower back has been painful for over a month and half ago. Patient contributes  her lower back pain to her sleeping position and work demands. 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 She refrains from lifting heavy objects. Alleviating factors include heat modalities and motrin to help with the pain. Aggravating factors include: laying down in certain positions. Prolong standing with home chores. Navigating stairs at apartment (~ 30 steps)  She denies numbness and tingling, radiating pain or direct trauma, Negative for bowel/bladder changes, saddle paresthesia  Social hx: She has four children (two at  home 43 and 32 year old).   Imaging: No  PAIN:    Pain Intensity: Present: 2/10, Best: 2/10, Worst:7/10 Pain location: L Lower back, L Gluteal  Pain Quality: intermittent and aching Radiating: No Numbness and tingling: No    PRECAUTIONS: Fall  WEIGHT BEARING RESTRICTIONS: No  FALLS: Has patient fallen in last 6 months? No  Living Environment Lives with: lives with their family 69 and 57 year old Lives in: House/apartment Stairs: Yes: External: 30 steps; on left going up Has following equipment at home: None  Prior level of function: Independent  Occupational demands: Lift 50#   Hobbies: Proofreader   Patient Goals: To help improve my lower back pain    OBJECTIVE:  Patient Surveys  Modified Oswestry:  MODIFIED OSWESTRY DISABILITY SCALE  Date: 07/13/2024 Score  Pain intensity 4 =  Pain medication provides me with little relief from pain.  2. Personal care (washing, dressing, etc.) 1 =  I can take care of myself normally, but it increases my pain.  3. Lifting 1 = I can lift heavy weights, but it causes increased pain.  4. Walking 1 = Pain prevents me from walking more than 1 mile.  5. Sitting 3 =  Pain prevents me from sitting more than  hour.  6. Standing 1 =  I can stand as long as I want but, it increases my pain.  7. Sleeping 2 =  Even when I take pain medication, I sleep less than 6  hours  8. Social Life 1 =  My social life is normal, but it increases my level of pain.  9. Traveling 2 =  My pain restricts my travel over 2 hours.  10. Employment/ Homemaking 3 = Pain prevents me from doing anything but light duties.  Total 19/50   Interpretation of scores: Score Category Description  0-20% Minimal Disability The patient can cope with most living activities. Usually no treatment is indicated apart from advice on lifting, sitting and exercise  21-40% Moderate Disability The patient experiences more pain and difficulty with sitting, lifting and standing. Travel and social life are more difficult and they may be disabled from work. Personal care, sexual activity and sleeping are not grossly affected, and the patient can usually be managed by conservative means  41-60% Severe Disability Pain remains the main problem in this group, but activities of daily living are affected. These patients require a detailed investigation  61-80% Crippled Back pain impinges on all aspects of the patients life. Positive intervention is required  81-100% Bed-bound These patients are either bed-bound or exaggerating their symptoms  Bluford FORBES Zoe DELENA Karon DELENA, et al. Surgery versus conservative management of stable thoracolumbar fracture: the PRESTO feasibility RCT. [place] ([place]): [employer]; 2021 Nov. [facility] Technology Assessment, No. 25.62.) Appendix 3, Oswestry Disability Index category descriptors. Available from: Findjewelers.cz  Minimally Clinically Important Difference (MCID) = 12.8%  Cognition WNL     Gross Musculoskeletal Assessment Tremor: None Bulk: Normal Tone: Normal No visible step-off along spinal column, no signs of scoliosis  GAIT: Distance walked: 2m Assistive device utilized: None Level of assistance: Complete Independence Comments: WNL, reciprocal gait   Posture: Lumbar lordosis: WNL Iliac crest height: Equal  bilaterally Lumbar lateral shift: Negative  AROM AROM (Normal range in degrees) AROM   Lumbar   Flexion (65) 100%*   Extension (30) 100%  Right lateral flexion (25) 100  Left lateral flexion (25) 100*   Right rotation (30) 100  Left rotation (30)  100       Hip Right Left  Flexion (125) WFL  WFL  Extension (15)    Abduction (40)    Adduction     Internal Rotation (45)    External Rotation (45)        Knee    Flexion (135)    Extension (0)        Ankle    Dorsiflexion (20)    Plantarflexion (50)    Inversion (35)    Eversion (15)    (* = pain; Blank rows = not tested)  LE MMT: MMT (out of 5) Right  Left   Hip flexion 4- 4-  Hip extension    Hip abduction 4 4  Hip adduction    Hip internal rotation 4 4  Hip external rotation 4 4  Knee flexion 5 5  Knee extension 5 5  Ankle dorsiflexion 5 5  Ankle plantarflexion 5 5  Ankle inversion    Ankle eversion    (* = pain; Blank rows = not tested)  Sensation Grossly intact to light touch throughout bilateral LEs as determined by testing dermatomes L2-S2. Proprioception, stereognosis, and hot/cold testing deferred on this date.  Reflexes R/L Knee Jerk (L3/4): 2+/2+  Ankle Jerk (S1/2): 2+/2+   Muscle Length Hamstrings: R: Negative L: Negative  Palpation Location Right Left         Lumbar paraspinals  1  Quadratus Lumborum  1  Gluteus Maximus    Gluteus Medius  1  Deep hip external rotators    PSIS    Fortin's Area (SIJ)    Greater Trochanter    (Blank rows = not tested) Graded on 0-4 scale (0 = no pain, 1 = pain, 2 = pain with wincing/grimacing/flinching, 3 = pain with withdrawal, 4 = unwilling to allow palpation)  Passive Accessory  Motion Pt denies reproduction of back pain with CPA L1-L5 and UPA bilaterally L1-L5. Generally, hypomobile throughout. Pt endorsed improvements in pain following repeated CPA.   Special Tests Lumbar Radiculopathy and Discogenic: SLR (SN 92, -LR 0.29): R: Negative L:   Negative Crossed SLR (SP 90): R: Negative L: Negative  Hip: FABER (SN 81): R: Negative L: Negative FADIR (SN 94): R: Negative L: Negative  Functional Tasks Lifting: Deferred Sit to stand: WNL Lateral Step-Down Test: Deferred  TODAY'S TREATMENT: DATE: 08/08/2024  Subjective: Patient reports that this past weekend went well; she reports that her pain is less and at most the pain has been a 2/10 NPS.  No further questions or concerns.   Therapeutic Exercise (with intent to improve core strength for increased lumbar stability):   Supine 90/90 with Double Leg Extension    3 x 10    Supine 90/90 with Torso rotation     3 x 10    Supine 90/90 with OH reach   3 x 10 - 3 Kg MB     Sidelying Clamshell   R/L: 3 x 10 - Blue TB around thigh   Resisted Supine Bridge    1 x 10 - Blue TB around thigh    2 x 10 - LE extended + Blue TB around thigh   Thoracic Open/Close Book                        1 x 10 - Alter dir    Hamstring Stretch  R/L: 30s/bout x 2 in order to improve ROM/muscle tension/tissue extensibility  Therapeutic Activity (with intent to improve functional activities such as lifting, squatting and lifting  with proper body mechanics):   Kettlebell Squats (20#)    3 x 10    Tall Kneeling Pallof Press + Horizontal Rotation   R/L: 2 x 10 - Blue TB    Lateral Stepping + Squat against resistance   3 x 5 steps to R/L     PATIENT EDUCATION:  Education details: Exercise Technique  Person educated: Patient Education method: Explanation, Demonstration, and Handouts Education comprehension: verbalized understanding and returned demonstration   HOME EXERCISE PROGRAM:  Access Code: [email or web address] URL: [email or web address] Date: 08/08/2024 Prepared by: Lonni Pall  Exercises - Supine Bridge  - 2 x daily - 7 x weekly - 2-3 sets - 10 reps - Straight Leg Raise  - 2 x daily - 7 x weekly - 2-3 sets - 10 reps - Supine Single Knee to Chest  - 2 x daily - 7  x weekly - 3 sets - 30 hold - Child's Pose with Sidebending  - 2 x daily - 7 x weekly - 2-3 sets - 30s hold - Quadruped Thoracic Rotation - Reach Under  - 1 x daily - 7 x weekly - 10 sets - 5 hold - Squat with Chair Touch and Resistance Loop  - 1 x daily - 3-4 x weekly - 2-3 sets - 10 reps - Sidelying Open Book Thoracic Lumbar Rotation and Extension  - 1 x daily - 7 x weekly - 2-3 sets - 10 reps   ASSESSMENT:  CLINICAL IMPRESSION: Continued PT POC in management of lower back back pain. Session focused on continued core strengthening for improved lumbar stability. She tolerated increased in intensity with all PT interventions without additional pain in her lower back. Her pain continues to improve with PT interventions and adherence to HEP. HEP updated to include additional exercises focused on thoracolumbar mobility.  Future PT sessions to challenge patient's core stability from mat level/supine positions to standing and half kneeling positions in order to simulate work related positions. She still has intermittent lower back pain that limits her full participation with recreational tasks and work related tasks.  She will benefit from skilled physical therapy interventions in order to maximize return to PLOF and improve QoL  OBJECTIVE IMPAIRMENTS: decreased activity tolerance, decreased strength, increased muscle spasms, and pain.   ACTIVITY LIMITATIONS: carrying, lifting, bending, standing, and stairs  PARTICIPATION LIMITATIONS: community activity and occupation  PERSONAL FACTORS: Age and Profession are also affecting patient's functional outcome.   REHAB POTENTIAL: Good  CLINICAL DECISION MAKING: Stable/uncomplicated  EVALUATION COMPLEXITY: Low   GOALS: Goals reviewed with patient? No  SHORT TERM GOALS: Target date: 08/10/2024  Pt will be independent with HEP in order to improve strength and decrease back pain to improve pain-free function at home and work. Baseline: 07/13/2024:   Goal status: INITIAL   LONG TERM GOALS: Target date: 09/07/2024  Pt will be able to squat and lift 40# kettlebell with good technique and minimal to no (< 3/10 NPS) pain in order to demonstrate ability to lift heavy objects at work.  Baseline: 07/13/2024: TBD Goal status: INITIAL  2.  Pt will decrease worst back pain by at least 2 points on the NPRS in order to demonstrate clinically significant reduction in back pain. Baseline: 07/13/2024:  7/10  Goal status: INITIAL  3.  Pt will decrease mODI score by at least 13 points in order demonstrate clinically significant reduction in back pain/disability.  Baseline: 07/13/2024: 19/50 - 38% Goal status: INITIAL  4.  Patient will be able to safely navigate a flight of 4 stairs 8 times (in clinic) using proper foot placement and handrail support, without requiring assistance from PT in order to demonstrate significant improvement in LE strength and safety. Baseline: 07/13/2024: Accomplished  Safely without pain.  Goal status: INITIAL  5.  Pt will increase 30s STS by at least 5 reps in order to demonstrate clinically significant improvement in LE strength. Baseline: 07/13/2024: 12 reps  Goal status: INITIAL    PLAN:  PT FREQUENCY: 1-2x/week  PT DURATION: 8 weeks  PLANNED INTERVENTIONS: 97164- PT Re-evaluation, 97110-Therapeutic exercises, 97530- Therapeutic activity, 97112- Neuromuscular re-education, 97535- Self Care, [id number]- Manual therapy, Patient/Family education, Stair training, Spinal mobilization, Cryotherapy, and Moist heat  PLAN FOR NEXT SESSION: Review HEP, Progress Hip Strengthening, Core strengthening, Thoracolumbar mobility (mat level)   Lonni Pall PT, DPT Physical Therapist-   08/08/2024, 8:47 AM  "

## 2024-08-09 ENCOUNTER — Ambulatory Visit

## 2024-08-10 ENCOUNTER — Ambulatory Visit

## 2024-08-11 ENCOUNTER — Ambulatory Visit

## 2024-08-16 ENCOUNTER — Ambulatory Visit

## 2024-08-17 ENCOUNTER — Ambulatory Visit

## 2024-08-17 DIAGNOSIS — M6281 Muscle weakness (generalized): Secondary | ICD-10-CM

## 2024-08-17 DIAGNOSIS — M5459 Other low back pain: Secondary | ICD-10-CM

## 2024-08-17 NOTE — Therapy (Signed)
 " OUTPATIENT PHYSICAL THERAPY THORACOLUMBAR TREATMENT   Patient Name: Kaitlyn Sims MRN: [id number] DOB:[birth year], 59 y.o., female Today's Date: 08/17/2024  END OF SESSION:  PT End of Session - 08/17/24 1032     Visit Number 5    Number of Visits 17    Date for Recertification  09/07/24    Authorization Type [employer] [id number] for PT vsts from    Authorization Time Period 12/17-2/11/26    Authorization - Visit Number 5    Authorization - Number of Visits 16    Progress Note Due on Visit 10    PT Start Time 1031    PT Stop Time 1115    PT Time Calculation (min) 44 min    Activity Tolerance Patient tolerated treatment well    Behavior During Therapy [facility] for tasks assessed/performed          History reviewed. No pertinent past medical history. History reviewed. No pertinent surgical history. Patient Active Problem List   Diagnosis Date Noted   Pure hypercholesterolemia 07/05/2024   Uterine prolapse 07/05/2024   Class 1 obesity with body mass index (BMI) of 32.0 to 32.9 in adult 06/07/2024   Chronic left-sided low back pain without sciatica 06/07/2024    PCP: Franchot Isaiah DELENA, MD  REFERRING PROVIDER: Franchot Isaiah DELENA, MD  REFERRING DIAG:  [phone] (ICD-10-CM) - Chronic left-sided low back pain without sciatica   RATIONALE FOR EVALUATION AND TREATMENT: Rehabilitation  THERAPY DIAG: Other low back pain  Muscle weakness (generalized)  ONSET DATE: 2 mos   FOLLOW-UP APPT SCHEDULED WITH REFERRING PROVIDER: Yes    SUBJECTIVE:                                                                                                                                                                                         SUBJECTIVE STATEMENT:    Patient is a 59 y.o. female with a chief concern of chronic L sided low back pain.   PERTINENT HISTORY:   Patient reports that her L lower back has been painful for over a month and half ago. Patient contributes her lower  back pain to her sleeping position and work demands. 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 She refrains from lifting heavy objects. Alleviating factors include heat modalities and motrin to help with the pain. Aggravating factors include: laying down in certain positions. Prolong standing with home chores. Navigating stairs at apartment (~ 30 steps)  She denies numbness and tingling, radiating pain or direct trauma, Negative for bowel/bladder changes, saddle paresthesia  Social hx: She has four children (two at  home 68 and 70 year old).   Imaging: No  PAIN:    Pain Intensity: Present: 2/10, Best: 2/10, Worst:7/10 Pain location: L Lower back, L Gluteal  Pain Quality: intermittent and aching Radiating: No Numbness and tingling: No    PRECAUTIONS: Fall  WEIGHT BEARING RESTRICTIONS: No  FALLS: Has patient fallen in last 6 months? No  Living Environment Lives with: lives with their family 62 and 57 year old Lives in: House/apartment Stairs: Yes: External: 30 steps; on left going up Has following equipment at home: None  Prior level of function: Independent  Occupational demands: Lift 50#   Hobbies: Proofreader   Patient Goals: To help improve my lower back pain    OBJECTIVE:  Patient Surveys  Modified Oswestry:  MODIFIED OSWESTRY DISABILITY SCALE  Date: 07/13/2024 Score  Pain intensity 4 =  Pain medication provides me with little relief from pain.  2. Personal care (washing, dressing, etc.) 1 =  I can take care of myself normally, but it increases my pain.  3. Lifting 1 = I can lift heavy weights, but it causes increased pain.  4. Walking 1 = Pain prevents me from walking more than 1 mile.  5. Sitting 3 =  Pain prevents me from sitting more than  hour.  6. Standing 1 =  I can stand as long as I want but, it increases my pain.  7. Sleeping 2 =  Even when I take pain medication, I sleep less than 6 hours  8.  Social Life 1 =  My social life is normal, but it increases my level of pain.  9. Traveling 2 =  My pain restricts my travel over 2 hours.  10. Employment/ Homemaking 3 = Pain prevents me from doing anything but light duties.  Total 19/50   Interpretation of scores: Score Category Description  0-20% Minimal Disability The patient can cope with most living activities. Usually no treatment is indicated apart from advice on lifting, sitting and exercise  21-40% Moderate Disability The patient experiences more pain and difficulty with sitting, lifting and standing. Travel and social life are more difficult and they may be disabled from work. Personal care, sexual activity and sleeping are not grossly affected, and the patient can usually be managed by conservative means  41-60% Severe Disability Pain remains the main problem in this group, but activities of daily living are affected. These patients require a detailed investigation  61-80% Crippled Back pain impinges on all aspects of the patients life. Positive intervention is required  81-100% Bed-bound These patients are either bed-bound or exaggerating their symptoms  Bluford FORBES Zoe DELENA Karon DELENA, et al. Surgery versus conservative management of stable thoracolumbar fracture: the PRESTO feasibility RCT. [place] ([place]): [employer]; 2021 Nov. [facility] Technology Assessment, No. 25.62.) Appendix 3, Oswestry Disability Index category descriptors. Available from: Findjewelers.cz  Minimally Clinically Important Difference (MCID) = 12.8%  Cognition WNL     Gross Musculoskeletal Assessment Tremor: None Bulk: Normal Tone: Normal No visible step-off along spinal column, no signs of scoliosis  GAIT: Distance walked: 65m Assistive device utilized: None Level of assistance: Complete Independence Comments: WNL, reciprocal gait   Posture: Lumbar lordosis: WNL Iliac crest height: Equal bilaterally Lumbar  lateral shift: Negative  AROM AROM (Normal range in degrees) AROM   Lumbar   Flexion (65) 100%*   Extension (30) 100%  Right lateral flexion (25) 100  Left lateral flexion (25) 100*   Right rotation (30) 100  Left rotation (30)  100       Hip Right Left  Flexion (125) WFL  WFL  Extension (15)    Abduction (40)    Adduction     Internal Rotation (45)    External Rotation (45)        Knee    Flexion (135)    Extension (0)        Ankle    Dorsiflexion (20)    Plantarflexion (50)    Inversion (35)    Eversion (15)    (* = pain; Blank rows = not tested)  LE MMT: MMT (out of 5) Right  Left   Hip flexion 4- 4-  Hip extension    Hip abduction 4 4  Hip adduction    Hip internal rotation 4 4  Hip external rotation 4 4  Knee flexion 5 5  Knee extension 5 5  Ankle dorsiflexion 5 5  Ankle plantarflexion 5 5  Ankle inversion    Ankle eversion    (* = pain; Blank rows = not tested)  Sensation Grossly intact to light touch throughout bilateral LEs as determined by testing dermatomes L2-S2. Proprioception, stereognosis, and hot/cold testing deferred on this date.  Reflexes R/L Knee Jerk (L3/4): 2+/2+  Ankle Jerk (S1/2): 2+/2+   Muscle Length Hamstrings: R: Negative L: Negative  Palpation Location Right Left         Lumbar paraspinals  1  Quadratus Lumborum  1  Gluteus Maximus    Gluteus Medius  1  Deep hip external rotators    PSIS    Fortin's Area (SIJ)    Greater Trochanter    (Blank rows = not tested) Graded on 0-4 scale (0 = no pain, 1 = pain, 2 = pain with wincing/grimacing/flinching, 3 = pain with withdrawal, 4 = unwilling to allow palpation)  Passive Accessory  Motion Pt denies reproduction of back pain with CPA L1-L5 and UPA bilaterally L1-L5. Generally, hypomobile throughout. Pt endorsed improvements in pain following repeated CPA.   Special Tests Lumbar Radiculopathy and Discogenic: SLR (SN 92, -LR 0.29): R: Negative L:  Negative Crossed SLR (SP  90): R: Negative L: Negative  Hip: FABER (SN 81): R: Negative L: Negative FADIR (SN 94): R: Negative L: Negative  Functional Tasks Lifting: Deferred Sit to stand: WNL Lateral Step-Down Test: Deferred  TODAY'S TREATMENT: DATE: 08/17/24  Subjective: Patient reports that her father recently passed. She reports that her back pain been very manageable. She reports that she hasn't felt the pain. No further questions or concerns.   Therapeutic Exercise (with intent to improve core strength for increased lumbar stability):   Supine lumbar rotation    2 x 10  Resisted Supine Bridge    1 x 10 - Blue TB around thigh    2 x 10 - LE extended + Blue TB around thigh   Supine 90/90 with Torso rotation     2 x 10 - alternating dir - 2 Kg Med ball in b/t leg   Supine 90/90 with OH reach   3 x 10 - 2 Kg MB    Supine 90/90 with Leg Extension while pallof pressing   3 x 10 - Red TB    Dead Bug    1 x 5    2 x 10   Hamstring Stretch  R/L: 30s/bout x 2 in order to improve ROM/muscle tension/tissue extensibility     Therapeutic Activity (with intent to improve functional activities such as lifting, squatting and lifting  with proper body mechanics):   Lateral Stepping + Squat against band  2 x 12' Green TB around ankles  2 x 12' Blue TB around ankles   Kettle Bell Squat   1 x 10 - 20#   2 x 10 - 30#   Half Kneeling Pallof Press  2 x 10 - Blue TB       PATIENT EDUCATION:  Education details: Exercise Technique  Person educated: Patient Education method: Explanation, Demonstration, and Handouts Education comprehension: verbalized understanding and returned demonstration   HOME EXERCISE PROGRAM:  Access Code: [email or web address] URL: [email or web address] Date: 08/17/2024 Prepared by: Lonni Pall  Exercises - Supine Bridge  - 2 x daily - 7 x weekly - 2-3 sets - 10 reps - Straight Leg Raise  - 2 x daily - 7 x weekly - 2-3 sets - 10 reps - Supine Single Knee to Chest   - 2 x daily - 7 x weekly - 3 sets - 30 hold - Child's Pose with Sidebending  - 2 x daily - 7 x weekly - 2-3 sets - 30s hold - Quadruped Thoracic Rotation - Reach Under  - 1 x daily - 7 x weekly - 10 sets - 5 hold - Squat with Chair Touch and Resistance Loop  - 1 x daily - 3-4 x weekly - 2-3 sets - 10 reps - Sidelying Open Book Thoracic Lumbar Rotation and Extension  - 1 x daily - 7 x weekly - 2-3 sets - 10 reps - Sidelying Lumbar Rotation Stretch  - 1 x daily - 7 x weekly - 3 sets - 30s hold - Supine 90/90 Overhead Dumbbell Raise  - 1 x daily - 3-4 x weekly - 2-3 sets - 10-12 reps - Tall Kneeling Anti-Rotation Press  - 1 x daily - 3-4 x weekly - 2-3 sets - 12 reps  Access Code: [email or web address] URL: [email or web address] Date: 08/08/2024 Prepared by: Lonni Pall  Exercises - Supine Bridge  - 2 x daily - 7 x weekly - 2-3 sets - 10 reps - Straight Leg Raise  - 2 x daily - 7 x weekly - 2-3 sets - 10 reps - Supine Single Knee to Chest  - 2 x daily - 7 x weekly - 3 sets - 30 hold - Child's Pose with Sidebending  - 2 x daily - 7 x weekly - 2-3 sets - 30s hold - Quadruped Thoracic Rotation - Reach Under  - 1 x daily - 7 x weekly - 10 sets - 5 hold - Squat with Chair Touch and Resistance Loop  - 1 x daily - 3-4 x weekly - 2-3 sets - 10 reps - Sidelying Open Book Thoracic Lumbar Rotation and Extension  - 1 x daily - 7 x weekly - 2-3 sets - 10 reps   ASSESSMENT:  CLINICAL IMPRESSION: Continued PT POC in management of lower back back pain. Session focused on continued core strengthening for improved lumbar stability. Good tolerance to progression of all PT interventions. Patient performed squat and lift with 30# KB without lower back pain. Great return demonstration of ability to maintain core brace with limb excursions in various positions. Her pain has been responding very well since the beginning of POC. Patient to take a 2 week hiatus from OPPT due to scheduling conflicts. PT updated HEP  and encouraged adherence in order to maintain progress. PT to monitor symptoms and reassess progress towards goals during return. She will benefit from skilled physical therapy interventions in order to maximize  return to PLOF and improve QoL  OBJECTIVE IMPAIRMENTS: decreased activity tolerance, decreased strength, increased muscle spasms, and pain.   ACTIVITY LIMITATIONS: carrying, lifting, bending, standing, and stairs  PARTICIPATION LIMITATIONS: community activity and occupation  PERSONAL FACTORS: Age and Profession are also affecting patient's functional outcome.   REHAB POTENTIAL: Good  CLINICAL DECISION MAKING: Stable/uncomplicated  EVALUATION COMPLEXITY: Low   GOALS: Goals reviewed with patient? No  SHORT TERM GOALS: Target date: 08/10/2024  Pt will be independent with HEP in order to improve strength and decrease back pain to improve pain-free function at home and work. Baseline: 07/13/2024: Initial HEP Provided  Goal status: INITIAL   LONG TERM GOALS: Target date: 09/07/2024  Pt will be able to squat and lift 40# kettlebell with good technique and minimal to no (< 3/10 NPS) pain in order to demonstrate ability to lift heavy objects at work.  Baseline: 07/13/2024: TBD; 08/17/2024: 30# KB Goal status: INITIAL  2.  Pt will decrease worst back pain by at least 2 points on the NPRS in order to demonstrate clinically significant reduction in back pain. Baseline: 07/13/2024:  7/10; 08/17/2024: 1/10 NPS  Goal status: INITIAL  3.  Pt will decrease mODI score by at least 13 points in order demonstrate clinically significant reduction in back pain/disability.       Baseline: 07/13/2024: 19/50 - 38% Goal status: INITIAL  4.  Patient will be able to safely navigate a flight of 4 stairs 8 times (in clinic) using proper foot placement and handrail support, without requiring assistance from PT in order to demonstrate significant improvement in LE strength and safety. Baseline:  07/13/2024: Accomplished  Safely without pain.  Goal status: INITIAL  5.  Pt will increase 30s STS by at least 5 reps in order to demonstrate clinically significant improvement in LE strength. Baseline: 07/13/2024: 12 reps  Goal status: INITIAL    PLAN:  PT FREQUENCY: 1-2x/week  PT DURATION: 8 weeks  PLANNED INTERVENTIONS: 97164- PT Re-evaluation, 97110-Therapeutic exercises, 97530- Therapeutic activity, 97112- Neuromuscular re-education, 97535- Self Care, [id number]- Manual therapy, Patient/Family education, Stair training, Spinal mobilization, Cryotherapy, and Moist heat  PLAN FOR NEXT SESSION: Review HEP, Progress Hip Strengthening, Core strengthening, Thoracolumbar mobility (mat level)   Lonni Pall PT, DPT Physical Therapist- [place]  08/17/2024, 10:36 AM  "

## 2024-08-18 ENCOUNTER — Ambulatory Visit

## 2024-08-23 ENCOUNTER — Ambulatory Visit

## 2024-08-25 ENCOUNTER — Ambulatory Visit

## 2024-08-29 ENCOUNTER — Ambulatory Visit

## 2024-08-31 ENCOUNTER — Ambulatory Visit

## 2024-09-06 ENCOUNTER — Ambulatory Visit

## 2024-09-08 ENCOUNTER — Ambulatory Visit

## 2024-09-13 ENCOUNTER — Ambulatory Visit

## 2024-09-15 ENCOUNTER — Ambulatory Visit

## 2024-09-20 ENCOUNTER — Ambulatory Visit

## 2024-10-04 ENCOUNTER — Encounter: Admitting: Obstetrics
